# Patient Record
Sex: Female | Born: 1965 | Race: Asian | Hispanic: No | Marital: Married | Smoking: Never smoker
Health system: Southern US, Community
[De-identification: ages and names within clinical notes are randomized; demographics above are authoritative.]

## PROBLEM LIST (undated history)

## (undated) DIAGNOSIS — Z79899 Other long term (current) drug therapy: Secondary | ICD-10-CM

## (undated) DIAGNOSIS — R928 Other abnormal and inconclusive findings on diagnostic imaging of breast: Secondary | ICD-10-CM

## (undated) DIAGNOSIS — G40909 Epilepsy, unspecified, not intractable, without status epilepticus: Secondary | ICD-10-CM

## (undated) DIAGNOSIS — L909 Atrophic disorder of skin, unspecified: Secondary | ICD-10-CM

## (undated) DIAGNOSIS — R002 Palpitations: Secondary | ICD-10-CM

## (undated) DIAGNOSIS — Q838 Other congenital malformations of breast: Secondary | ICD-10-CM

## (undated) DIAGNOSIS — E669 Obesity, unspecified: Secondary | ICD-10-CM

## (undated) DIAGNOSIS — R609 Edema, unspecified: Secondary | ICD-10-CM

## (undated) DIAGNOSIS — L919 Hypertrophic disorder of the skin, unspecified: Secondary | ICD-10-CM

## (undated) DIAGNOSIS — N63 Unspecified lump in unspecified breast: Secondary | ICD-10-CM

## (undated) DIAGNOSIS — Z0000 Encounter for general adult medical examination without abnormal findings: Secondary | ICD-10-CM

## (undated) HISTORY — DX: Unspecified lump in unspecified breast: N63.0

## (undated) HISTORY — DX: Obesity, unspecified: E66.9

## (undated) HISTORY — DX: Atrophic disorder of skin, unspecified: L90.9

## (undated) HISTORY — DX: Palpitations: R00.2

## (undated) HISTORY — DX: Epilepsy, unspecified, not intractable, without status epilepticus: G40.909

## (undated) HISTORY — DX: Other long term (current) drug therapy: Z79.899

## (undated) HISTORY — DX: Edema, unspecified: R60.9

## (undated) HISTORY — PX: HEMORRHOID SURGERY: SHX153

## (undated) HISTORY — DX: Other congenital malformations of breast: Q83.8

## (undated) HISTORY — DX: Hypertrophic disorder of the skin, unspecified: L91.9

## (undated) HISTORY — PX: ELBOW SURGERY: SHX618

## (undated) HISTORY — DX: Encounter for general adult medical examination without abnormal findings: Z00.00

## (undated) HISTORY — DX: Other abnormal and inconclusive findings on diagnostic imaging of breast: R92.8

---

## 2006-07-11 ENCOUNTER — Ambulatory Visit: Payer: Self-pay | Admitting: Family Medicine

## 2007-04-29 DIAGNOSIS — R569 Unspecified convulsions: Secondary | ICD-10-CM

## 2007-10-16 ENCOUNTER — Ambulatory Visit: Payer: Self-pay | Admitting: Family Medicine

## 2007-10-16 ENCOUNTER — Other Ambulatory Visit: Admission: RE | Admit: 2007-10-16 | Discharge: 2007-10-16 | Payer: Self-pay | Admitting: Family Medicine

## 2007-10-16 ENCOUNTER — Encounter (INDEPENDENT_AMBULATORY_CARE_PROVIDER_SITE_OTHER): Payer: Self-pay | Admitting: Internal Medicine

## 2007-10-16 DIAGNOSIS — E669 Obesity, unspecified: Secondary | ICD-10-CM

## 2007-10-16 DIAGNOSIS — N63 Unspecified lump in unspecified breast: Secondary | ICD-10-CM

## 2007-10-23 ENCOUNTER — Encounter (INDEPENDENT_AMBULATORY_CARE_PROVIDER_SITE_OTHER): Payer: Self-pay | Admitting: Internal Medicine

## 2007-10-25 ENCOUNTER — Encounter (INDEPENDENT_AMBULATORY_CARE_PROVIDER_SITE_OTHER): Payer: Self-pay | Admitting: Internal Medicine

## 2007-10-30 ENCOUNTER — Ambulatory Visit: Payer: Self-pay | Admitting: Family Medicine

## 2007-11-01 LAB — CONVERTED CEMR LAB
AST: 18 units/L (ref 0–37)
Basophils Relative: 0.7 % (ref 0.0–1.0)
CO2: 31 meq/L (ref 19–32)
Creatinine, Ser: 0.6 mg/dL (ref 0.4–1.2)
Eosinophils Relative: 1.5 % (ref 0.0–5.0)
HDL: 49.8 mg/dL (ref >39.0)
Hemoglobin: 13 g/dL (ref 12.0–15.0)
MCHC: 34.4 g/dL (ref 30.0–36.0)
Monocytes Absolute: 0.3 10*3/uL (ref 0.2–0.7)
Neutrophils Relative %: 61.9 % (ref 43.0–77.0)
Potassium: 4.5 meq/L (ref 3.5–5.1)
RDW: 11.3 % — ABNORMAL LOW (ref 11.5–14.6)
Sodium: 142 meq/L (ref 135–145)
TSH: 1.15 microintl units/mL (ref 0.35–5.50)
Total Bilirubin: 0.5 mg/dL (ref 0.3–1.2)
Total CHOL/HDL Ratio: 3.6
Total Protein: 6.8 g/dL (ref 6.0–8.3)
Triglycerides: 195 mg/dL — ABNORMAL HIGH (ref 0–149)

## 2008-10-22 ENCOUNTER — Telehealth (INDEPENDENT_AMBULATORY_CARE_PROVIDER_SITE_OTHER): Payer: Self-pay | Admitting: Internal Medicine

## 2008-10-28 ENCOUNTER — Encounter (INDEPENDENT_AMBULATORY_CARE_PROVIDER_SITE_OTHER): Payer: Self-pay | Admitting: Internal Medicine

## 2008-10-28 ENCOUNTER — Other Ambulatory Visit: Admission: RE | Admit: 2008-10-28 | Discharge: 2008-10-28 | Payer: Self-pay | Admitting: Family Medicine

## 2008-10-28 ENCOUNTER — Ambulatory Visit: Payer: Self-pay | Admitting: Family Medicine

## 2008-10-28 LAB — CONVERTED CEMR LAB: Pap Smear: NORMAL

## 2008-11-04 ENCOUNTER — Ambulatory Visit: Payer: Self-pay | Admitting: Family Medicine

## 2008-11-05 ENCOUNTER — Encounter (INDEPENDENT_AMBULATORY_CARE_PROVIDER_SITE_OTHER): Payer: Self-pay | Admitting: *Deleted

## 2008-11-05 LAB — CONVERTED CEMR LAB
ALT: 19 units/L (ref 0–35)
AST: 20 units/L (ref 0–37)
Albumin: 3.7 g/dL (ref 3.5–5.2)
BUN: 8 mg/dL (ref 6–23)
Basophils Absolute: 0 10*3/uL (ref 0.0–0.1)
Basophils Relative: 0.3 % (ref 0.0–3.0)
CO2: 29 meq/L (ref 19–32)
Calcium: 8.8 mg/dL (ref 8.4–10.5)
Chloride: 105 meq/L (ref 96–112)
Cholesterol: 179 mg/dL (ref 0–200)
Creatinine, Ser: 0.6 mg/dL (ref 0.4–1.2)
Eosinophils Relative: 1.5 % (ref 0.0–5.0)
Hemoglobin: 12.8 g/dL (ref 12.0–15.0)
LDL Cholesterol: 112 mg/dL — ABNORMAL HIGH (ref 0–99)
Lymphocytes Relative: 33.9 % (ref 12.0–46.0)
MCHC: 35.6 g/dL (ref 30.0–36.0)
MCV: 87.4 fL (ref 78.0–100.0)
Neutro Abs: 4.3 10*3/uL (ref 1.4–7.7)
RBC: 4.1 M/uL (ref 3.87–5.11)
TSH: 1.32 microintl units/mL (ref 0.35–5.50)
Total Bilirubin: 0.6 mg/dL (ref 0.3–1.2)
Total Protein: 6.4 g/dL (ref 6.0–8.3)
WBC: 7.2 10*3/uL (ref 4.5–10.5)

## 2009-08-04 ENCOUNTER — Ambulatory Visit: Payer: Self-pay | Admitting: Family Medicine

## 2009-08-04 DIAGNOSIS — R609 Edema, unspecified: Secondary | ICD-10-CM | POA: Insufficient documentation

## 2009-08-18 ENCOUNTER — Ambulatory Visit: Payer: Self-pay | Admitting: Family Medicine

## 2009-11-03 ENCOUNTER — Ambulatory Visit: Payer: Self-pay | Admitting: Family Medicine

## 2009-11-03 ENCOUNTER — Encounter (INDEPENDENT_AMBULATORY_CARE_PROVIDER_SITE_OTHER): Payer: Self-pay | Admitting: Internal Medicine

## 2009-11-03 ENCOUNTER — Other Ambulatory Visit: Admission: RE | Admit: 2009-11-03 | Discharge: 2009-11-03 | Payer: Self-pay | Admitting: Family Medicine

## 2009-11-03 DIAGNOSIS — L909 Atrophic disorder of skin, unspecified: Secondary | ICD-10-CM | POA: Insufficient documentation

## 2009-11-03 DIAGNOSIS — L919 Hypertrophic disorder of the skin, unspecified: Secondary | ICD-10-CM

## 2009-11-04 LAB — CONVERTED CEMR LAB
ALT: 19 units/L (ref 0–35)
AST: 17 units/L (ref 0–37)
Basophils Relative: 0.1 % (ref 0.0–3.0)
CO2: 27 meq/L (ref 19–32)
Calcium: 8.7 mg/dL (ref 8.4–10.5)
Cholesterol: 216 mg/dL — ABNORMAL HIGH (ref 0–200)
Creatinine, Ser: 0.6 mg/dL (ref 0.4–1.2)
Eosinophils Absolute: 0.1 10*3/uL (ref 0.0–0.7)
Eosinophils Relative: 1.5 % (ref 0.0–5.0)
GFR calc non Af Amer: 116.84 mL/min (ref >60)
HDL: 54.5 mg/dL (ref >39.00)
Hemoglobin: 13.1 g/dL (ref 12.0–15.0)
Lymphocytes Relative: 33.7 % (ref 12.0–46.0)
Monocytes Relative: 5 % (ref 3.0–12.0)
Neutro Abs: 3.9 10*3/uL (ref 1.4–7.7)
Neutrophils Relative %: 59.7 % (ref 43.0–77.0)
RBC: 4.18 M/uL (ref 3.87–5.11)
Sodium: 137 meq/L (ref 135–145)
VLDL: 38.6 mg/dL (ref 0.0–40.0)
WBC: 6.5 10*3/uL (ref 4.5–10.5)

## 2009-11-09 ENCOUNTER — Encounter (INDEPENDENT_AMBULATORY_CARE_PROVIDER_SITE_OTHER): Payer: Self-pay | Admitting: *Deleted

## 2009-11-24 ENCOUNTER — Encounter (INDEPENDENT_AMBULATORY_CARE_PROVIDER_SITE_OTHER): Payer: Self-pay | Admitting: Internal Medicine

## 2009-12-01 ENCOUNTER — Encounter (INDEPENDENT_AMBULATORY_CARE_PROVIDER_SITE_OTHER): Payer: Self-pay | Admitting: Internal Medicine

## 2009-12-01 ENCOUNTER — Encounter (INDEPENDENT_AMBULATORY_CARE_PROVIDER_SITE_OTHER): Payer: Self-pay | Admitting: *Deleted

## 2010-06-01 ENCOUNTER — Ambulatory Visit: Payer: Self-pay | Admitting: Family Medicine

## 2010-06-01 DIAGNOSIS — R002 Palpitations: Secondary | ICD-10-CM | POA: Insufficient documentation

## 2010-06-02 LAB — CONVERTED CEMR LAB
Albumin: 4.1 g/dL (ref 3.5–5.2)
BUN: 13 mg/dL (ref 6–23)
Basophils Relative: 0.2 % (ref 0.0–3.0)
Calcium: 8.9 mg/dL (ref 8.4–10.5)
Eosinophils Absolute: 0.1 10*3/uL (ref 0.0–0.7)
Eosinophils Relative: 1.2 % (ref 0.0–5.0)
GFR calc non Af Amer: 123.62 mL/min (ref >60)
Glucose, Bld: 83 mg/dL (ref 70–99)
HCT: 34.2 % — ABNORMAL LOW (ref 36.0–46.0)
Lymphs Abs: 2.1 10*3/uL (ref 0.7–4.0)
MCHC: 35.2 g/dL (ref 30.0–36.0)
MCV: 86.4 fL (ref 78.0–100.0)
Monocytes Absolute: 0.4 10*3/uL (ref 0.1–1.0)
Neutrophils Relative %: 60.5 % (ref 43.0–77.0)
Platelets: 293 10*3/uL (ref 150.0–400.0)
RBC: 3.95 M/uL (ref 3.87–5.11)
TSH: 1.04 microintl units/mL (ref 0.35–5.50)
Total Bilirubin: 0.4 mg/dL (ref 0.3–1.2)
WBC: 6.5 10*3/uL (ref 4.5–10.5)

## 2011-01-24 NOTE — Assessment & Plan Note (Signed)
Summary: PALPITATIONS, R/S PER DR Leam Madero/NT   Vital Signs:  Patient profile:   45 year old female Height:      62.25 inches Weight:      171.50 pounds BMI:     31.23 Temp:     98.8 degrees F oral Pulse rate:   68 / minute Pulse rhythm:   regular BP sitting:   100 / 80  (left arm) Cuff size:   regular  Vitals Entered By: Linde Gillis CMA Duncan Dull) (June 01, 2010 12:12 PM) CC: palpitations   History of Present Illness: 45 yo female new to me here for palpitations.  Started two weeks ago.  Mainly occurs at rest.  When first started, had as many as 25 episodes, now is much less frequent.  Started about same time as she changed her diet, cutting out unhealthy foods, eating several small meals per day.  No dietary supplements. Drinks very little caffeine.  Mild "twinge" of chest discomfort once while it was occuring, no other CP. No dizziness, blurred vision, SOB.   No syncope.  Does have a seizure disorder, no recent seizures or changes in dilantin level.  Current Medications (verified): 1)  Dilantin 100 Mg Caps (Phenytoin Sodium Extended) .... Three Capsules At Bedtime.  Allergies (verified): No Known Drug Allergies  Past History:  Past Medical History: Last updated: 04/29/2007 Seizure disorder  Past Surgical History: Last updated: 04/29/2007 R elbow surg as a child Hemorrhoidectomy  Family History: Last updated: 11/03/2009 Father: L&W age 11 Mother: L&W age 72 arthritis knees Siblings: 1 br---1 sis both L&W MGF died age 64 MGM--DM M cousin--colon Ca  Social History: Last updated: 04/29/2007 Marital Status: Married Children: 1 daughter, age 22 ([address] Occupation: Education officer, community in [place] Born in [place]  Review of Systems      See HPI General:  Denies malaise. Eyes:  Denies blurring. ENT:  Denies difficulty swallowing. CV:  Complains of palpitations; denies chest pain or discomfort, difficulty breathing at night, fainting, fatigue, leg cramps with exertion,  lightheadness, near fainting, shortness of breath with exertion, swelling of feet, and swelling of hands. GI:  Denies abdominal pain and change in bowel habits. MS:  Denies joint pain, joint redness, joint swelling, and muscle weakness. Neuro:  Denies falling down, headaches, seizures, visual disturbances, and weakness. Psych:  Denies anxiety and depression. Endo:  Denies cold intolerance, excessive hunger, excessive thirst, excessive urination, heat intolerance, polyuria, and weight change.  Physical Exam  General:  alert, well-developed, well-nourished, and well-hydrated.   Neck:  thyromegaly, nontender Lungs:  normal respiratory effort, no intercostal retractions, no accessory muscle use, and normal breath sounds.   Heart:  normal rate, regular rhythm, and no murmur.   Extremities:  trace non-pitting edema ankles bilat only Neurologic:  alert & oriented X3, sensation intact to light touch, and gait normal.   Psych:  normally interactive and good eye contact.     Impression & Recommendations:  Problem # 1:  PALPITATIONS (ICD-785.1) Assessment New Etiology unknown but appears to be slowly resovling.  EKG and PE exam within normal limits. Will get blood work to rule out reversible causes- TSH, BMET, TSH, CBC. If all blood work within normal limits, will send for holter monitor.  Pt in agreement with plan. Orders: Venipuncture (91478) TLB-CBC Platelet - w/Differential (85025-CBCD) TLB-BMP (Basic Metabolic Panel-BMET) (80048-METABOL) TLB-Hepatic/Liver Function Pnl (80076-HEPATIC) TLB-TSH (Thyroid Stimulating Hormone) (84443-TSH) EKG w/ Interpretation (93000)  Complete Medication List: 1)  Dilantin 100 Mg Caps (Phenytoin sodium extended) .... Three capsules  at bedtime.  Current Allergies (reviewed today): No known allergies

## 2011-01-25 ENCOUNTER — Encounter (INDEPENDENT_AMBULATORY_CARE_PROVIDER_SITE_OTHER): Payer: BC Managed Care – PPO | Admitting: Family Medicine

## 2011-01-25 ENCOUNTER — Encounter: Payer: Self-pay | Admitting: Family Medicine

## 2011-01-25 ENCOUNTER — Other Ambulatory Visit (HOSPITAL_COMMUNITY)
Admission: RE | Admit: 2011-01-25 | Discharge: 2011-01-25 | Disposition: A | Discharge status: Home / Self Care | Payer: BC Managed Care – PPO | Source: Ambulatory Visit | Attending: Family Medicine | Admitting: Family Medicine

## 2011-01-25 ENCOUNTER — Other Ambulatory Visit: Payer: Self-pay | Admitting: Family Medicine

## 2011-01-25 DIAGNOSIS — E785 Hyperlipidemia, unspecified: Secondary | ICD-10-CM

## 2011-01-25 DIAGNOSIS — Z136 Encounter for screening for cardiovascular disorders: Secondary | ICD-10-CM

## 2011-01-25 DIAGNOSIS — Z124 Encounter for screening for malignant neoplasm of cervix: Secondary | ICD-10-CM | POA: Insufficient documentation

## 2011-01-25 DIAGNOSIS — Z0000 Encounter for general adult medical examination without abnormal findings: Secondary | ICD-10-CM

## 2011-01-25 DIAGNOSIS — Q838 Other congenital malformations of breast: Secondary | ICD-10-CM | POA: Insufficient documentation

## 2011-01-25 DIAGNOSIS — Z1159 Encounter for screening for other viral diseases: Secondary | ICD-10-CM | POA: Insufficient documentation

## 2011-01-25 DIAGNOSIS — Z79899 Other long term (current) drug therapy: Secondary | ICD-10-CM

## 2011-01-25 LAB — CBC WITH DIFFERENTIAL/PLATELET
Basophils Absolute: 0 10*3/uL (ref 0.0–0.1)
Basophils Relative: 0.5 % (ref 0.0–3.0)
Hemoglobin: 11.1 g/dL — ABNORMAL LOW (ref 12.0–15.0)
Lymphocytes Relative: 31.2 % (ref 12.0–46.0)
Monocytes Relative: 5.8 % (ref 3.0–12.0)
Neutro Abs: 3.4 10*3/uL (ref 1.4–7.7)
Neutrophils Relative %: 61.8 % (ref 43.0–77.0)
RBC: 3.81 Mil/uL — ABNORMAL LOW (ref 3.87–5.11)

## 2011-01-25 LAB — HEPATIC FUNCTION PANEL
AST: 17 U/L (ref 0–37)
Albumin: 4 g/dL (ref 3.5–5.2)
Total Bilirubin: 0.2 mg/dL — ABNORMAL LOW (ref 0.3–1.2)

## 2011-01-25 LAB — BASIC METABOLIC PANEL
BUN: 7 mg/dL (ref 6–23)
Calcium: 9.2 mg/dL (ref 8.4–10.5)
GFR: 102.26 mL/min (ref >60.00)
Glucose, Bld: 86 mg/dL (ref 70–99)
Potassium: 4.3 mEq/L (ref 3.5–5.1)
Sodium: 139 mEq/L (ref 135–145)

## 2011-01-25 LAB — LIPID PANEL
HDL: 76.5 mg/dL (ref >39.00)
Triglycerides: 75 mg/dL (ref 0.0–149.0)
VLDL: 15 mg/dL (ref 0.0–40.0)

## 2011-01-25 LAB — HM PAP SMEAR

## 2011-01-25 LAB — LDL CHOLESTEROL, DIRECT: Direct LDL: 122.7 mg/dL

## 2011-01-27 ENCOUNTER — Encounter: Payer: Self-pay | Admitting: Family Medicine

## 2011-02-01 ENCOUNTER — Ambulatory Visit: Payer: Self-pay | Admitting: Family Medicine

## 2011-02-01 ENCOUNTER — Encounter: Payer: Self-pay | Admitting: Family Medicine

## 2011-02-01 NOTE — Letter (Signed)
Summary: Results Follow up Letter  [place] at [facility]  [address] [place], [place] [address]   Phone: [phone]  Fax: [phone]    01/27/2011 MRN: [id number]  [facility] [address] [place], [place]  [address]  Dear Ms. Bally,  The following are the results of your recent test(s):  Test         Result    Pap Smear:        Normal __X___  Not Normal _____ Comments: Repeat in 2-3 years. ______________________________________________________ Cholesterol: LDL(Bad cholesterol):         Your goal is less than:         HDL (Good cholesterol):       Your goal is more than: Comments:  ______________________________________________________ Mammogram:        Normal _____  Not Normal _____ Comments:  ___________________________________________________________________ Hemoccult:        Normal _____  Not normal _______ Comments:    _____________________________________________________________________ Other Tests:    We routinely do not discuss normal results over the telephone.  If you desire a copy of the results, or you have any questions about this information we can discuss them at your next office visit.   Sincerely,     Dr. Ruthe Mannan

## 2011-02-01 NOTE — Assessment & Plan Note (Signed)
Summary: CPX/TRANSFER FROM [facility]IE/CLE   Vital Signs:  Patient profile:   45 year old female Height:      62.25 inches Weight:      148.50 pounds BMI:     27.04 Temp:     98.3 degrees F oral Pulse rate:   72 / minute Pulse rhythm:   regular BP sitting:   100 / 70  (right arm) Cuff size:   regular  Vitals Entered By: Linde Gillis CMA Duncan Dull) (January 25, 2011 9:15 AM) CC: complete physicial with pap   History of Present Illness: 45 yo here for CPX.  Seizure disorder- has not had a seizure in years, followed by neurology. Pt requests labs drawn for neurology to be added to her labs today.  Heart palpitations- started taking Zumba classes, has lost over 20 pounds since last office visit!!  Palpitations are very infrequent.  No CP, SOB or dizziness.  Well woman- no recent h/o abnormal pap smears. Denies any dysuria or vaginal discharge.  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  Exercising a lot now and feels it rubbing and pressure.  Current Medications (verified): 1)  Dilantin 100 Mg Caps (Phenytoin Sodium Extended) .... Three Capsules At Bedtime.  Allergies (verified): No Known Drug Allergies  Past History:  Past Medical History: Last updated: 04/29/2007 Seizure disorder  Past Surgical History: Last updated: 04/29/2007 R elbow surg as a child Hemorrhoidectomy  Family History: Last updated: 11/03/2009 Father: L&W age 26 Mother: L&W age 80 arthritis knees Siblings: 1 br---1 sis both L&W MGF died age 59 MGM--DM M cousin--colon Ca  Social History: Last updated: 04/29/2007 Marital Status: Married Children: 1 daughter, age 68 ([address] Occupation: Education officer, community in [place] Born in [place]  Risk Factors: Alcohol Use: 0 (11/03/2009) Caffeine Use: 0 (10/28/2008) Exercise: yes (11/03/2009)  Risk Factors: Smoking Status: never (11/03/2009) Passive Smoke Exposure: no  (10/28/2008)  Review of Systems      See HPI General:  Denies malaise. Eyes:  Denies blurring. ENT:  Denies difficulty swallowing. CV:  Denies chest pain or discomfort. Resp:  Denies shortness of breath. GI:  Denies abdominal pain and change in bowel habits. GU:  Denies abnormal vaginal bleeding and dysuria. MS:  Denies joint pain, joint redness, and joint swelling. Derm:  Denies rash. Neuro:  Denies headaches. Psych:  Denies anxiety and depression. Endo:  Denies cold intolerance and heat intolerance. Heme:  Denies abnormal bruising and bleeding.  Physical Exam  General:  alert, well-developed, well-nourished, and well-hydrated.   Head:  normocephalic, atraumatic, and no abnormalities observed.   Eyes:  vision grossly intact, pupils equal, pupils round, and pupils reactive to light.   Ears:  R ear normal and L ear normal.   Nose:  no external deformity.   Mouth:  good dentition.   Neck:  No deformities, masses, or tenderness noted. Breasts:  No mass, nodules, thickening, tenderness, bulging, retraction, inflamation, nipple discharge or skin changes noted.  Does have a large palpable, non tender and soft mass under right axilla. Lungs:  normal respiratory effort, no intercostal retractions, no accessory muscle use, and normal breath sounds.   Heart:  normal rate, regular rhythm, and no murmur.   Abdomen:  Bowel sounds positive,abdomen soft and non-tender without masses, organomegaly or hernias noted. Rectal:  no external abnormalities and no hemorrhoids.   Genitalia:  Pelvic Exam:        External: normal female genitalia without  lesions or masses        Vagina: normal without lesions or masses        Cervix: normal without lesions or masses        Adnexa: normal bimanual exam without masses or fullness        Uterus: normal by palpation        Pap smear: performed Msk:  No deformity or scoliosis noted of thoracic or lumbar spine.   Extremities:  no edema Neurologic:  alert &  oriented X3, sensation intact to light touch, and gait normal.   Skin:  Intact without suspicious lesions or rashes Psych:  normally interactive and good eye contact.     Impression & Recommendations:  Problem # 1:  Preventive Health Care (ICD-V70.0) Reviewed preventive care protocols, scheduled due services, and updated immunizations Discussed nutrition, exercise, diet, and healthy lifestyle.  Pap smear today. FLP, BMET today. Mammogram ordered today. Orders: Venipuncture (62130) TLB-BMP (Basic Metabolic Panel-BMET) (80048-METABOL)  Problem # 2:  LONG-TERM (CURRENT) USE OF OTHER MEDICATIONS (ICD-V58.69) Assessment: Unchanged Liver function, CBC, dilantin level. Will also order DEXA as she has been on dilantin for >10 years. Orders: Radiology Referral (Radiology) TLB-Hepatic/Liver Function Pnl (80076-HEPATIC) TLB-CBC Platelet - w/Differential (85025-CBCD)  Problem # 3:  PALPITATIONS (ICD-785.1) Assessment: Improved Congratulated her on her weight loss!  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 Orders: Surgical Referral (Surgery)  Complete Medication List: 1)  Dilantin 100 Mg Caps (Phenytoin sodium extended) .... Three capsules at bedtime.  Other Orders: TLB-Lipid Panel (80061-LIPID) T-Dilantin (Phenytoin) (86578-46962)  Patient Instructions: 1)  Please stop by to see Shirlee Limerick on your way out to set up your surgery appointment, your bone density test and your mammogram.   Orders Added: 1)  Radiology Referral [Radiology] 2)  TLB-Lipid Panel [80061-LIPID] 3)  Venipuncture [36415] 4)  TLB-BMP (Basic Metabolic Panel-BMET) [80048-METABOL] 5)  T-Dilantin (Phenytoin) [95284-13244] 6)  Radiology Referral [Radiology] 7)  TLB-Hepatic/Liver Function Pnl [80076-HEPATIC] 8)  Surgical Referral [Surgery] 9)  TLB-CBC Platelet - w/Differential [85025-CBCD] 10)  Est. Patient 40-64  years [01027]    Current Allergies (reviewed today): No known allergies      Prevention & Chronic Care Immunizations   Influenza vaccine: Not documented    Tetanus booster: 11/03/2009: Tdap   Tetanus booster due: 11/04/2019    Pneumococcal vaccine: Not documented  Other Screening   Pap smear: NEGATIVE FOR INTRAEPITHELIAL LESIONS OR MALIGNANCY.  (11/03/2009)   Pap smear action/deferral: Ordered  (01/25/2011)   Pap smear due: 10/2009    Mammogram: Normal  (11/24/2009)   Mammogram action/deferral: Ordered  (01/25/2011)   Mammogram due: 11/24/2010 or 11/25/2011   Smoking status: never  (11/03/2009)  Lipids   Total Cholesterol: 216  (11/03/2009)   Lipid panel action/deferral: Lipid Panel ordered   LDL: 112  (11/04/2008)   LDL Direct: 138.7  (11/03/2009)   HDL: 54.50  (11/03/2009)   Triglycerides: 193.0  (11/03/2009)   Nursing Instructions: Pap smear today Schedule screening mammogram (see order)

## 2011-02-06 ENCOUNTER — Encounter: Payer: Self-pay | Admitting: Family Medicine

## 2011-02-08 ENCOUNTER — Encounter: Payer: Self-pay | Admitting: Family Medicine

## 2011-02-15 ENCOUNTER — Ambulatory Visit: Payer: Self-pay | Admitting: Family Medicine

## 2011-02-15 ENCOUNTER — Encounter: Payer: Self-pay | Admitting: Family Medicine

## 2011-02-15 NOTE — Letter (Signed)
Summary: Request for Diagnosis [facility]  Request for Diagnosis Code/[facility]   Imported By: Maryln Gottron 02/10/2011 13:30:29  _____________________________________________________________________  External Attachment:    Type:   Image     Comment:   External Document

## 2011-02-15 NOTE — Letter (Signed)
Summary: Results Follow up Letter  [place] at [facility]  [address] [place], [place] [address]   Phone: [phone]  Fax: [phone]    02/06/2011 MRN: [id number]  [facility] [address] [place], [place]  [address]  Dear Ms. Noyola,  The following are the results of your recent test(s):  Test         Result    Pap Smear:        Normal _____  Not Normal _____ Comments: ______________________________________________________ Cholesterol: LDL(Bad cholesterol):         Your goal is less than:         HDL (Good cholesterol):       Your goal is more than: Comments:  ______________________________________________________ Mammogram:        Normal _____  Not Normal _____ Comments:  ___________________________________________________________________ Hemoccult:        Normal _____  Not normal _______ Comments:    _____________________________________________________________________ Other Tests: Bone Density test was normal.    We routinely do not discuss normal results over the telephone.  If you desire a copy of the results, or you have any questions about this information we can discuss them at your next office visit.   Sincerely,      Dr. Ruthe Mannan

## 2011-02-20 ENCOUNTER — Encounter: Payer: Self-pay | Admitting: Family Medicine

## 2011-02-20 DIAGNOSIS — R928 Other abnormal and inconclusive findings on diagnostic imaging of breast: Secondary | ICD-10-CM | POA: Insufficient documentation

## 2011-02-21 ENCOUNTER — Encounter: Payer: Self-pay | Admitting: Family Medicine

## 2011-03-01 ENCOUNTER — Encounter: Payer: Self-pay | Admitting: Family Medicine

## 2011-03-01 ENCOUNTER — Ambulatory Visit: Payer: Self-pay | Admitting: Family Medicine

## 2011-03-01 ENCOUNTER — Telehealth (INDEPENDENT_AMBULATORY_CARE_PROVIDER_SITE_OTHER): Payer: Self-pay | Admitting: *Deleted

## 2011-03-02 ENCOUNTER — Encounter: Payer: Self-pay | Admitting: Family Medicine

## 2011-03-02 NOTE — Miscellaneous (Signed)
Summary: Orders Update  Clinical Lists Changes  Problems: Added new problem of MAMMOGRAM, ABNORMAL, RIGHT (ICD-793.80) Orders: Added new Referral order of Radiology Referral (Radiology) - Signed 

## 2011-03-07 NOTE — Letter (Signed)
Summary: Results Follow up Letter  [place] at [facility]  [address] [place], [place] [address]   Phone: [phone]  Fax: [phone]    03/02/2011 MRN: [id number]  [facility] [address] [place], [place]  [address]  Dear Christine Saunders,  The following are the results of your recent test(s):  Test         Result    Pap Smear:        Normal _____  Not Normal _____ Comments: ______________________________________________________ Cholesterol: LDL(Bad cholesterol):         Your goal is less than:         HDL (Good cholesterol):       Your goal is more than: Comments:  ______________________________________________________ Mammogram:        Normal __X___  Not Normal _____ Comments: Additional views of your mammogram were benign.  Repeat in one year.  ___________________________________________________________________ Hemoccult:        Normal _____  Not normal _______ Comments:    _____________________________________________________________________ Other Tests:    We routinely do not discuss normal results over the telephone.  If you desire a copy of the results, or you have any questions about this information we can discuss them at your next office visit.   Sincerely,     Dr. Ruthe Mannan

## 2011-03-23 NOTE — Progress Notes (Signed)
Summary: code needs to be changed for dexa  Phone Note Call from Patient Call back at [phone], cell, leave message   Caller: Patient Summary of Call: Pt states her neurologist had wanted pt to have a bone density test, which you ordered, but pt states it was ordered as a diagnostic, should have been ordered as screening, and now pt's insurance wont cover the cost.  Pt is asking that we change the code to a screening test.  If you have any questions please call Darl Pikes at Dr Balinda Quails office- [phone]. Initial call taken by: Lowella Petties CMA, AAMA,  March 01, 2011 10:26 AM  Follow-up for Phone Call        Aram Beecham or Jamesetta So, please look into this for me. Ruthe Mannan MD  March 01, 2011 10:33 AM   Additional Follow-up for Phone Call Additional follow up Details #1::        Sent request to [facility] for review, asking if he can review charged. If we can refile as a screeming code.Daine Gip  March 01, 2011 3:37 PM

## 2012-03-27 ENCOUNTER — Encounter: Payer: Self-pay | Admitting: Family Medicine

## 2012-03-27 ENCOUNTER — Other Ambulatory Visit: Payer: Self-pay | Admitting: Family Medicine

## 2012-03-27 ENCOUNTER — Other Ambulatory Visit (INDEPENDENT_AMBULATORY_CARE_PROVIDER_SITE_OTHER): Payer: BC Managed Care – PPO

## 2012-03-27 ENCOUNTER — Ambulatory Visit (INDEPENDENT_AMBULATORY_CARE_PROVIDER_SITE_OTHER): Payer: BC Managed Care – PPO | Admitting: Family Medicine

## 2012-03-27 DIAGNOSIS — Z136 Encounter for screening for cardiovascular disorders: Secondary | ICD-10-CM

## 2012-03-27 DIAGNOSIS — Z0000 Encounter for general adult medical examination without abnormal findings: Secondary | ICD-10-CM

## 2012-03-27 DIAGNOSIS — Z79899 Other long term (current) drug therapy: Secondary | ICD-10-CM

## 2012-03-27 LAB — LIPID PANEL
HDL: 79 mg/dL (ref >39.00)
Total CHOL/HDL Ratio: 3
Triglycerides: 63 mg/dL (ref 0.0–149.0)

## 2012-03-27 LAB — COMPREHENSIVE METABOLIC PANEL
AST: 18 U/L (ref 0–37)
Albumin: 4 g/dL (ref 3.5–5.2)
Alkaline Phosphatase: 66 U/L (ref 39–117)
CO2: 27 mEq/L (ref 19–32)
Calcium: 9.3 mg/dL (ref 8.4–10.5)
Creatinine, Ser: 0.6 mg/dL (ref 0.4–1.2)
Sodium: 139 mEq/L (ref 135–145)

## 2012-03-27 LAB — LDL CHOLESTEROL, DIRECT: Direct LDL: 107.7 mg/dL

## 2012-03-27 NOTE — Progress Notes (Signed)
No show

## 2012-03-28 LAB — PHENYTOIN LEVEL, TOTAL: Phenytoin Lvl: 17.9 ug/mL (ref 10.0–20.0)

## 2012-04-03 ENCOUNTER — Other Ambulatory Visit: Payer: BC Managed Care – PPO

## 2012-04-10 ENCOUNTER — Encounter: Payer: BC Managed Care – PPO | Admitting: Family Medicine

## 2012-05-08 ENCOUNTER — Encounter: Payer: BC Managed Care – PPO | Admitting: Family Medicine

## 2012-06-12 ENCOUNTER — Ambulatory Visit (INDEPENDENT_AMBULATORY_CARE_PROVIDER_SITE_OTHER): Payer: BC Managed Care – PPO | Admitting: Family Medicine

## 2012-06-12 ENCOUNTER — Encounter: Payer: Self-pay | Admitting: Family Medicine

## 2012-06-12 ENCOUNTER — Other Ambulatory Visit (HOSPITAL_COMMUNITY)
Admission: RE | Admit: 2012-06-12 | Discharge: 2012-06-12 | Disposition: A | Discharge status: Home / Self Care | Payer: BC Managed Care – PPO | Source: Ambulatory Visit | Attending: Family Medicine | Admitting: Family Medicine

## 2012-06-12 VITALS — BP 100/60 | HR 64 | Temp 98.3°F | Ht 63.25 in | Wt 145.0 lb

## 2012-06-12 DIAGNOSIS — Z1231 Encounter for screening mammogram for malignant neoplasm of breast: Secondary | ICD-10-CM

## 2012-06-12 DIAGNOSIS — Z0000 Encounter for general adult medical examination without abnormal findings: Secondary | ICD-10-CM

## 2012-06-12 DIAGNOSIS — Z01419 Encounter for gynecological examination (general) (routine) without abnormal findings: Secondary | ICD-10-CM | POA: Insufficient documentation

## 2012-06-12 DIAGNOSIS — R569 Unspecified convulsions: Secondary | ICD-10-CM

## 2012-06-12 MED ORDER — PIMECROLIMUS 1 % EX CREA: TOPICAL_CREAM | Freq: Two times a day (BID) | CUTANEOUS | Status: AC

## 2012-06-12 NOTE — Addendum Note (Signed)
Addended by: Eliezer Bottom on: 06/12/2012 09:56 AM   Modules accepted: Orders

## 2012-06-12 NOTE — Patient Instructions (Addendum)
Great to see you. Please stop by to see Christine Saunders on your way out to set up your mammogram.    

## 2012-06-12 NOTE — Progress Notes (Signed)
46yo here for CPX.  Seizure disorder- has not had a seizure in years, followed by neurology.  Lab Results  Component Value Date   PHENYTOIN 17.9 03/27/2012   Well woman- due for mammogram She would like a pap smear today.  Working on diet, continuing to do [employer]. Lipid panel much improved!  Feels great. Lab Results  Component Value Date   CHOL 201* 03/27/2012   HDL 79.00 03/27/2012   LDLCALC 112* 11/04/2008   LDLDIRECT 107.7 03/27/2012   TRIG 63.0 03/27/2012   CHOLHDL 3 03/27/2012    Patient Active Problem List  Diagnosis  . OBESITY  . BREAST MASSES, BILATERAL  . UNSPECIFIED HYPERTROPHIC&ATROPHIC CONDITION SKIN  . SEIZURE DISORDER  . DEPENDENT EDEMA, LEGS, BILATERAL  . PALPITATIONS  . SPECIFIED CONGENITAL ANOMALIES OF BREAST  . MAMMOGRAM, ABNORMAL, RIGHT  . Routine general medical examination at a health care facility   Past Medical History  Diagnosis Date  . Mass of breast     bilateral  . Edema     dependent edema of legs, bilateral  . Encounter for long-term (current) use of other medications   . Abnormal mammogram, unspecified   . Obesity, unspecified   . Palpitations   . Routine general medical examination at a health care facility   . Seizure disorder   . Specified congenital anomalies of breast   . Unspecified hypertrophic and atrophic condition of skin    Past Surgical History  Procedure Date  . Elbow surgery     right, as a child  . Hemorrhoid surgery    History  Substance Use Topics  . Smoking status: Not on file  . Smokeless tobacco: Not on file  . Alcohol Use:    Family History  Problem Relation Age of Onset  . Arthritis Mother     knees  . Diabetes Maternal Grandmother   . Cancer Cousin     maternal cousin, colon cancer   Allergies not on file Current Outpatient Prescriptions on File Prior to Visit  Medication Sig Dispense Refill  . phenytoin (DILANTIN) 100 MG ER capsule Take by mouth 3 (three) times daily.       The PMH, PSH, Social History,  Family History, Medications, and allergies have been reviewed in [facility], and have been updated if relevant.   Review of Systems  See HPI  Patient reports no  vision/ hearing changes,anorexia, weight change, fever ,adenopathy, persistant / recurrent hoarseness, swallowing issues, chest pain, edema,persistant / recurrent cough, hemoptysis, dyspnea(rest, exertional, paroxysmal nocturnal), gastrointestinal  bleeding (melena, rectal bleeding), abdominal pain, excessive heart burn, GU symptoms(dysuria, hematuria, pyuria, voiding/incontinence  Issues) syncope, focal weakness, severe memory loss, concerning skin lesions, depression, anxiety, abnormal bruising/bleeding, major joint swelling, breast masses or abnormal vaginal bleeding.    Physical Exam  BP 100/60  Pulse 64  Temp 98.3 F (36.8 C)  Ht 5' 3.25" (1.607 m)  Wt 145 lb (65.772 kg)  BMI 25.48 kg/m2 Wt Readings from Last 3 Encounters:  06/12/12 145 lb (65.772 kg)  01/25/11 148 lb 8 oz (67.359 kg)  06/01/10 171 lb 8 oz (77.792 kg)    General:  Well-developed,well-nourished,in no acute distress; alert,appropriate and cooperative throughout examination Head:  normocephalic and atraumatic.   Eyes:  vision grossly intact, pupils equal, pupils round, and pupils reactive to light.   Ears:  R ear normal and L ear normal.   Nose:  no external deformity.   Mouth:  good dentition.   Neck:  No deformities, masses, or tenderness  noted. Breasts:  No mass, nodules, thickening, tenderness, bulging, retraction, inflamation, nipple discharge or skin changes noted.   Lungs:  Normal respiratory effort, chest expands symmetrically. Lungs are clear to auscultation, no crackles or wheezes. Heart:  Normal rate and regular rhythm. S1 and S2 normal without gallop, murmur, click, rub or other extra sounds. Abdomen:  Bowel sounds positive,abdomen soft and non-tender without masses, organomegaly or hernias noted. Rectal:  no external abnormalities.   Genitalia:   Pelvic Exam:        External: normal female genitalia without lesions or masses        Vagina: normal without lesions or masses        Cervix: normal without lesions or masses        Adnexa: normal bimanual exam without masses or fullness        Uterus: normal by palpation        Pap smear: performed Msk:  No deformity or scoliosis noted of thoracic or lumbar spine.   Extremities:  No clubbing, cyanosis, edema, or deformity noted with normal full range of motion of all joints.   Neurologic:  alert & oriented X3 and gait normal.   Skin:  Intact without suspicious lesions or rashes, multiple nevi, non appear dysplastic. Cervical Nodes:  No lymphadenopathy noted Axillary Nodes:  No palpable lymphadenopathy Psych:  Cognition and judgment appear intact. Alert and cooperative with normal attention span and concentration. No apparent delusions, illusions, hallucinations  Assessment and Plan:  1. Routine general medical examination at a health care facility  Reviewed preventive care protocols, scheduled due services, and updated immunizations Discussed nutrition, exercise, diet, and healthy lifestyle.    2. SEIZURE DISORDER  Stable, no recurrent seizures.   3. Other screening mammogram  MM Digital Screening

## 2012-06-13 ENCOUNTER — Encounter: Payer: Self-pay | Admitting: *Deleted

## 2012-07-03 ENCOUNTER — Ambulatory Visit: Payer: Self-pay | Admitting: Family Medicine

## 2012-07-03 ENCOUNTER — Encounter: Payer: Self-pay | Admitting: Family Medicine

## 2012-07-05 ENCOUNTER — Encounter: Payer: Self-pay | Admitting: *Deleted

## 2012-07-05 ENCOUNTER — Encounter: Payer: Self-pay | Admitting: Family Medicine

## 2013-07-03 IMAGING — MG MM CAD SCREENING MAMMO
1 series · 4 of 4 positions shown · non-contrast
Comparison: none

REASON FOR EXAM: SCR MAMMO NO ORDER
COMMENTS:

PROCEDURE:     MAM - MAM DGTL SCRN MAM NO ORDER W/CAD  - July 03, 2012  [DATE]
RESULT:     No dominant masses or pathologic clustered calcifications
demonstrated.  Moderately dense, nodular parenchymal pattern is present. The
exam is stable from multiple prior exams. CAD evaluation is nonfocal.

[R CC · right · 4 of 4 slices shown]
[im 1/4]
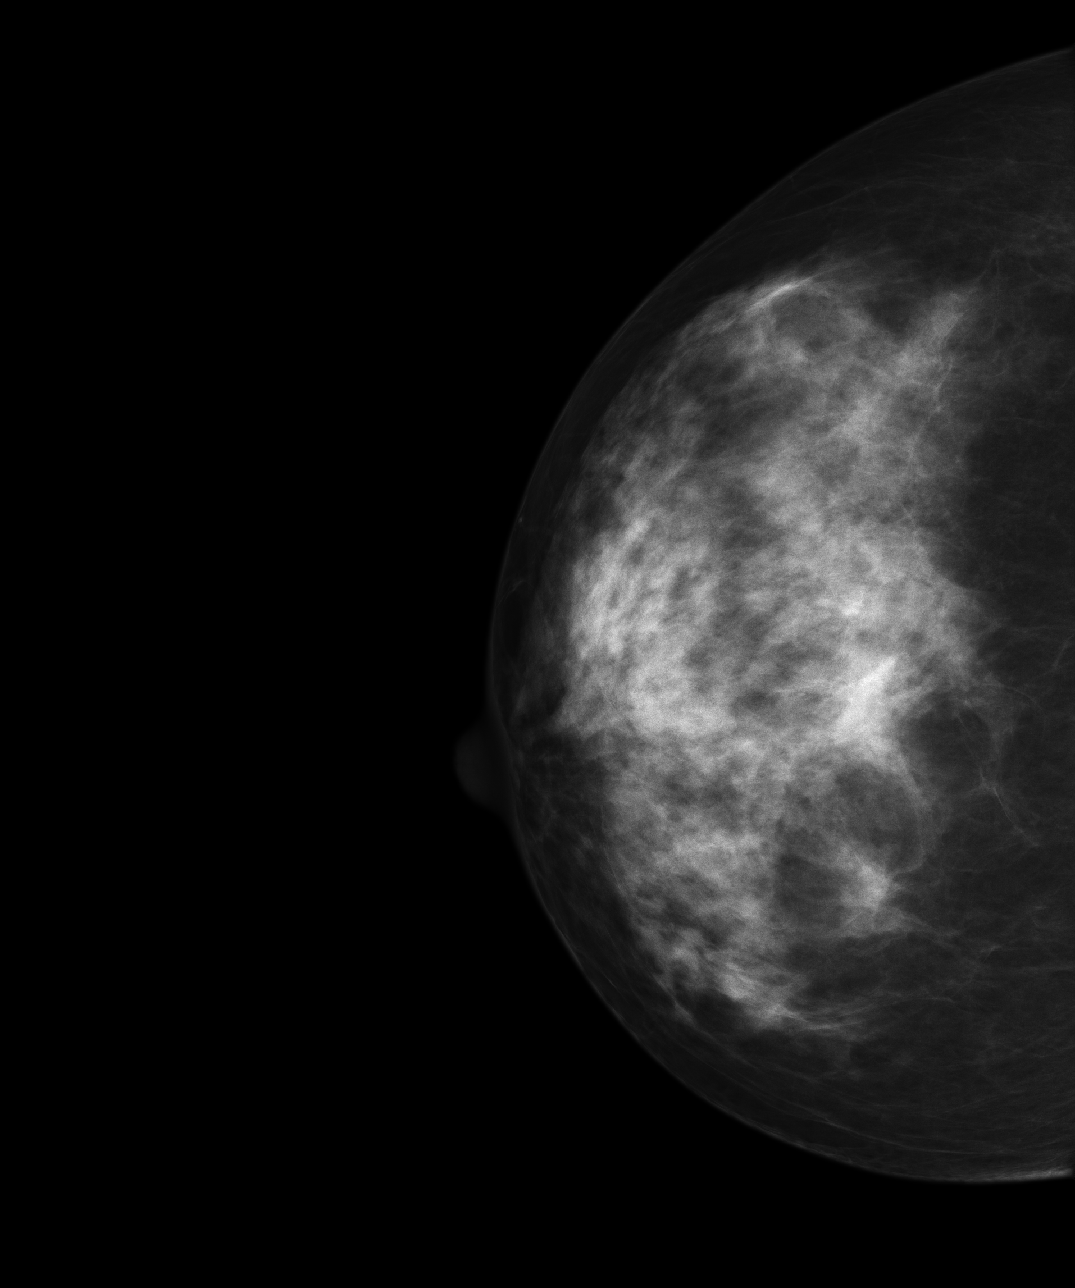
[im 2/4]
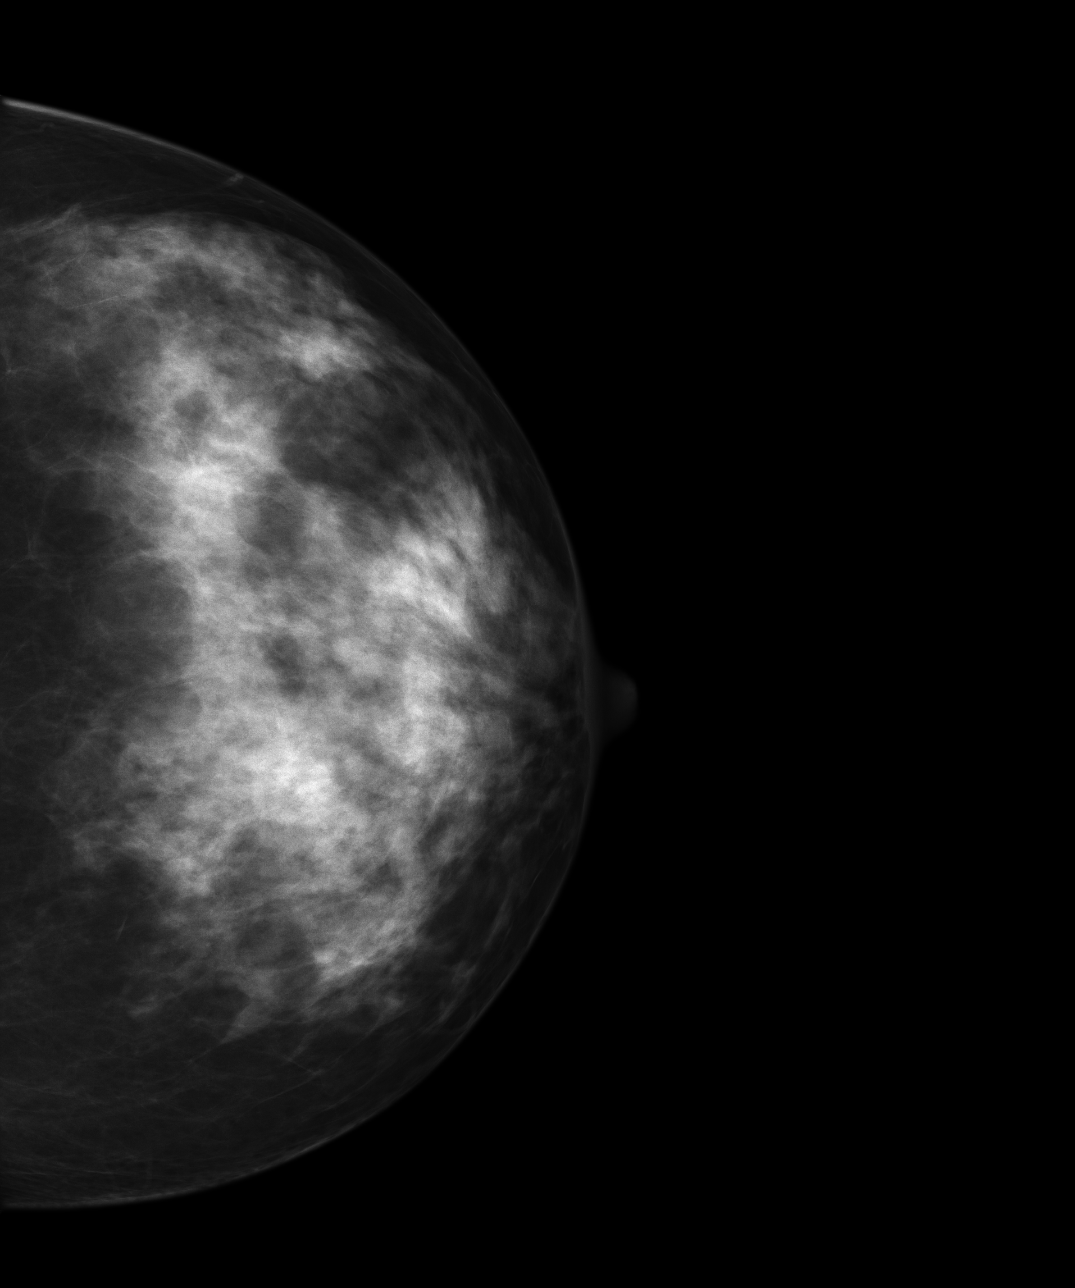
[im 3/4]
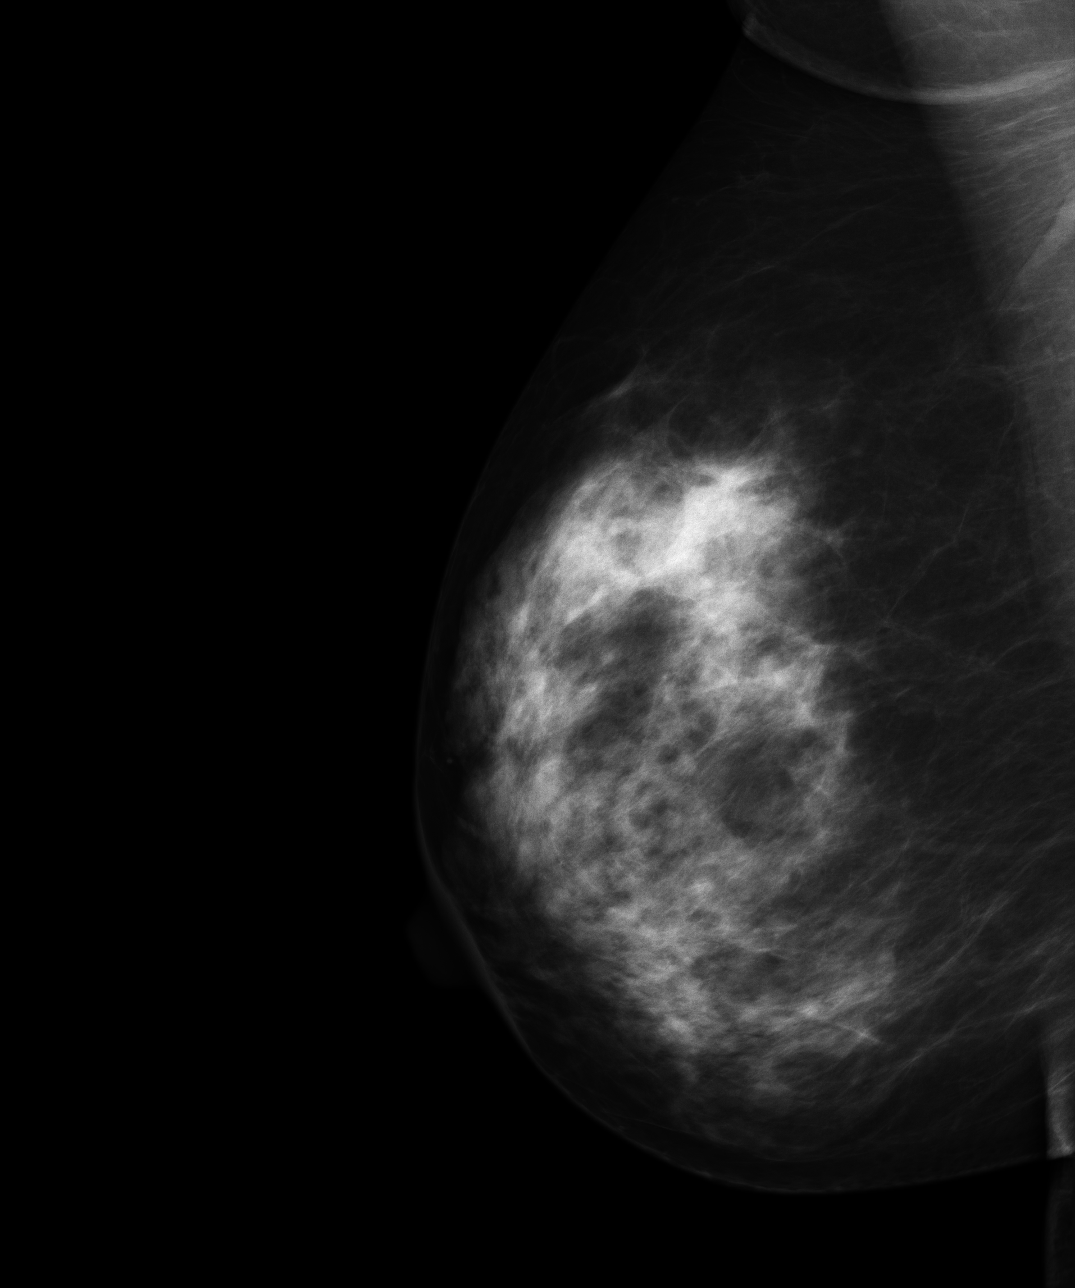
[im 4/4]
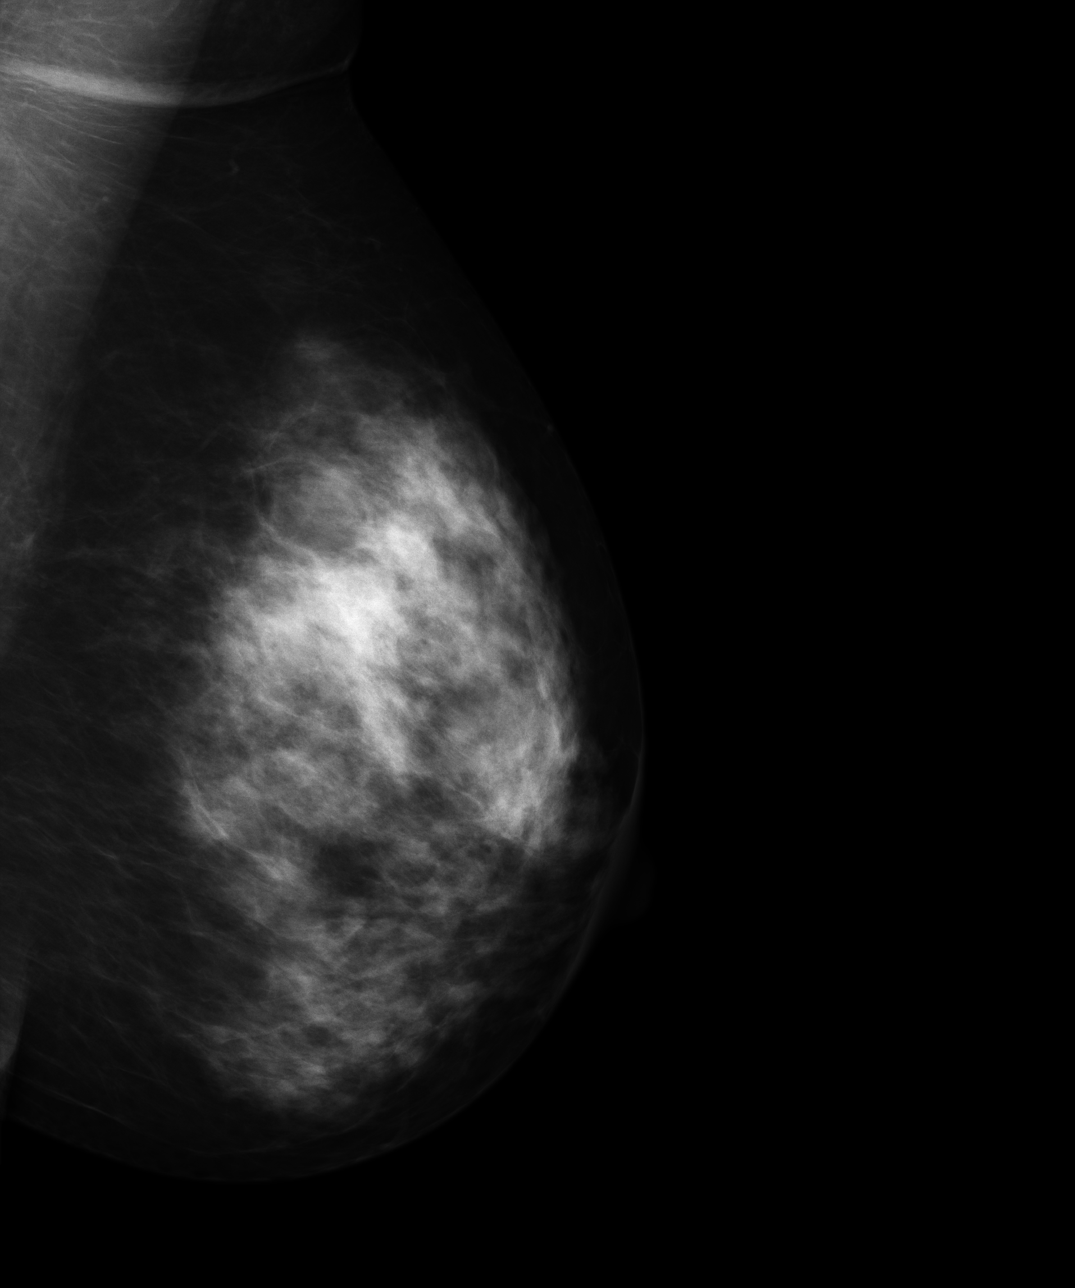

[4 of 4 positions shown; findings below may reference images not displayed]

IMPRESSION: 1. Stable, benign exam. Yearly follow-up mammogram suggested.

BI-RADS: Category 2 - Benign Finding.

A NEGATIVE MAMMOGRAM REPORT DOES NOT PRECLUDE BIOPSY OR OTHER EVALUATION OF
A CLINICALLY PALPABLE OR OTHERWISE SUSPICIOUS MASS OR LESION. BREAST CANCER
MAY NOT BE DETECTED BY MAMMOGRAPHY IN UP TO 10% OF CASES.

## 2013-11-12 ENCOUNTER — Encounter: Payer: Self-pay | Admitting: Internal Medicine

## 2013-11-12 ENCOUNTER — Ambulatory Visit (INDEPENDENT_AMBULATORY_CARE_PROVIDER_SITE_OTHER): Payer: BC Managed Care – PPO | Admitting: Internal Medicine

## 2013-11-12 VITALS — BP 110/68 | HR 76 | Temp 98.4°F | Wt 149.0 lb

## 2013-11-12 DIAGNOSIS — Z0000 Encounter for general adult medical examination without abnormal findings: Secondary | ICD-10-CM

## 2013-11-12 DIAGNOSIS — R5381 Other malaise: Secondary | ICD-10-CM

## 2013-11-12 NOTE — Progress Notes (Signed)
Pre-visit discussion using our clinic review tool. No additional management support is needed unless otherwise documented below in the visit note.  

## 2013-11-12 NOTE — Patient Instructions (Signed)

## 2013-11-12 NOTE — Progress Notes (Signed)
Subjective:    Patient ID: Christine Saunders, female    DOB: [birth year], 47 y.o.   MRN: [id number]  HPI  Pt presents to the clinic today for her annual exam: She has no concerns today.  Flu: 09/2013 Tetanus: 2010 LMP: 11/06/13 Pap Smear: 2013 Mammogram: 2013 Eye doctor: yearly  Dentist: yearly  Review of Systems      Past Medical History  Diagnosis Date  . Mass of breast     bilateral  . Edema     dependent edema of legs, bilateral  . Encounter for long-term (current) use of other medications   . Abnormal mammogram, unspecified   . Obesity, unspecified   . Palpitations   . Routine general medical examination at a health care facility   . Seizure disorder   . Specified congenital anomalies of breast   . Unspecified hypertrophic and atrophic condition of skin     Current Outpatient Prescriptions  Medication Sig Dispense Refill  . primidone (MYSOLINE) 50 MG tablet Take 25 mg by mouth 2 (two) times daily.      . vitamin B-12 (CYANOCOBALAMIN) 1000 MCG tablet Take 1,000 mcg by mouth daily.       No current facility-administered medications for this visit.    No Known Allergies  Family History  Problem Relation Age of Onset  . Arthritis Mother     knees  . Diabetes Maternal Grandmother   . Cancer Cousin     maternal cousin, colon cancer    History   Social History  . Marital Status: Married    Spouse Name: N/A    Number of Children: 1  . Years of Education: N/A   Occupational History  . dentist     in [place]   Social History Main Topics  . Smoking status: Never Smoker   . Smokeless tobacco: Not on file  . Alcohol Use: Not on file  . Drug Use: Not on file  . Sexual Activity: Not on file   Other Topics Concern  . Not on file   Social History Narrative   Born in [place].     Constitutional: Denies fever, malaise, fatigue, headache or abrupt weight changes.  HEENT: Denies eye pain, eye redness, ear pain, ringing in the ears, wax buildup, runny nose,  nasal congestion, bloody nose, or sore throat. Respiratory: Denies difficulty breathing, shortness of breath, cough or sputum production.   Cardiovascular: Denies chest pain, chest tightness, palpitations or swelling in the hands or feet.  Gastrointestinal: Denies abdominal pain, bloating, constipation, diarrhea or blood in the stool.  GU: Denies urgency, frequency, pain with urination, burning sensation, blood in urine, odor or discharge. Musculoskeletal: Denies decrease in range of motion, difficulty with gait, muscle pain or joint pain and swelling.  Skin: Denies redness, rashes, lesions or ulcercations.  Neurological: Denies dizziness, difficulty with memory, difficulty with speech or problems with balance and coordination.   No other specific complaints in a complete review of systems (except as listed in HPI above).  Objective:   Physical Exam   BP 110/68  Pulse 76  Temp(Src) 98.4 F (36.9 C) (Oral)  Wt 149 lb (67.586 kg)  SpO2 98%  LMP 11/06/2013 Wt Readings from Last 3 Encounters:  11/12/13 149 lb (67.586 kg)  06/12/12 145 lb (65.772 kg)  01/25/11 148 lb 8 oz (67.359 kg)    General: Appears her stated age, well developed, well nourished in NAD. Skin: Warm, dry and intact. No rashes, lesions or ulcerations noted. HEENT: Head: normal  shape and size; Eyes: sclera white, no icterus, conjunctiva pink, PERRLA and EOMs intact; Ears: Tm's gray and intact, normal light reflex; Nose: mucosa pink and moist, septum midline; Throat/Mouth: Teeth present, mucosa pink and moist, no exudate, lesions or ulcerations noted.  Neck: Normal range of motion. Neck supple, trachea midline. No massses, lumps or thyromegaly present.  Cardiovascular: Normal rate and rhythm. S1,S2 noted.  No murmur, rubs or gallops noted. No JVD or BLE edema. No carotid bruits noted. Pulmonary/Chest: Normal effort and positive vesicular breath sounds. No respiratory distress. No wheezes, rales or ronchi noted.  Abdomen:  Soft and nontender. Normal bowel sounds, no bruits noted. No distention or masses noted. Liver, spleen and kidneys non palpable. Musculoskeletal: Normal range of motion. No signs of joint swelling. No difficulty with gait.  Neurological: Alert and oriented. Cranial nerves II-XII intact. Coordination normal. +DTRs bilaterally. Psychiatric: Mood and affect normal. Behavior is normal. Judgment and thought content normal.    BMET    Component Value Date/Time   NA 139 03/27/2012 1315   K 5.1 03/27/2012 1315   CL 103 03/27/2012 1315   CO2 27 03/27/2012 1315   GLUCOSE 83 03/27/2012 1315   BUN 17 03/27/2012 1315   CREATININE 0.6 03/27/2012 1315   CALCIUM 9.3 03/27/2012 1315   GFRNONAA 123.62 06/01/2010 1242   GFRAA 142 11/04/2008 1032    Lipid Panel     Component Value Date/Time   CHOL 201* 03/27/2012 1315   TRIG 63.0 03/27/2012 1315   HDL 79.00 03/27/2012 1315   CHOLHDL 3 03/27/2012 1315   VLDL 12.6 03/27/2012 1315   LDLCALC 112* 11/04/2008 1032    CBC    Component Value Date/Time   WBC 5.6 01/25/2011 0938   RBC 3.81* 01/25/2011 0938   HGB 11.1* 01/25/2011 0938   HCT 32.6* 01/25/2011 0938   PLT 307.0 01/25/2011 0938   MCV 85.4 01/25/2011 0938   MCHC 34.2 01/25/2011 0938   RDW 12.9 01/25/2011 0938   LYMPHSABS 1.7 01/25/2011 0938   MONOABS 0.3 01/25/2011 0938   EOSABS 0.0 01/25/2011 0938   BASOSABS 0.0 01/25/2011 0938    Hgb A1C No results found for this basename: HGBA1C        Assessment & Plan:   Preventative Health Maintenance:  All HM UTD Will have labs done at [facility] Will schedule Mammogram  RTC in 1 year or sooner if needed

## 2013-11-24 ENCOUNTER — Other Ambulatory Visit: Payer: Self-pay | Admitting: Family Medicine

## 2013-11-24 DIAGNOSIS — Z0000 Encounter for general adult medical examination without abnormal findings: Secondary | ICD-10-CM

## 2013-11-24 DIAGNOSIS — R5381 Other malaise: Secondary | ICD-10-CM

## 2013-11-27 LAB — LIPID PANEL
Cholesterol, Total: 200 mg/dL — ABNORMAL HIGH (ref 100–199)
LDL Calculated: 101 mg/dL — ABNORMAL HIGH (ref 0–99)
Triglycerides: 88 mg/dL (ref 0–149)
VLDL Cholesterol Cal: 18 mg/dL (ref 5–40)

## 2013-11-27 LAB — CBC
HCT: 30.3 % — ABNORMAL LOW (ref 34.0–46.6)
Hemoglobin: 9.5 g/dL — ABNORMAL LOW (ref 11.1–15.9)
MCH: 22.7 pg — ABNORMAL LOW (ref 26.6–33.0)
MCHC: 31.4 g/dL — ABNORMAL LOW (ref 31.5–35.7)
MCV: 72 fL — ABNORMAL LOW (ref 79–97)
RBC: 4.19 x10E6/uL (ref 3.77–5.28)
RDW: 14.7 % (ref 12.3–15.4)

## 2013-11-27 LAB — COMPREHENSIVE METABOLIC PANEL
ALT: 14 IU/L (ref 0–32)
AST: 15 IU/L (ref 0–40)
Albumin/Globulin Ratio: 2.1 (ref 1.1–2.5)
Alkaline Phosphatase: 64 IU/L (ref 39–117)
BUN/Creatinine Ratio: 17 (ref 9–23)
BUN: 12 mg/dL (ref 6–24)
CO2: 23 mmol/L (ref 18–29)
Chloride: 99 mmol/L (ref 97–108)
GFR calc Af Amer: 119 mL/min/{1.73_m2} (ref >59)
GFR calc non Af Amer: 103 mL/min/{1.73_m2} (ref >59)
Globulin, Total: 2.1 g/dL (ref 1.5–4.5)
Glucose: 84 mg/dL (ref 65–99)
Potassium: 5.1 mmol/L (ref 3.5–5.2)
Sodium: 138 mmol/L (ref 134–144)
Total Bilirubin: 0.2 mg/dL (ref 0.0–1.2)
Total Protein: 6.6 g/dL (ref 6.0–8.5)

## 2013-11-27 LAB — TSH: TSH: 1.38 u[IU]/mL (ref 0.450–4.500)

## 2013-11-27 LAB — HEMOGLOBIN A1C: Est. average glucose Bld gHb Est-mCnc: 111 mg/dL

## 2013-11-27 LAB — VITAMIN B12: Vitamin B-12: 1077 pg/mL — ABNORMAL HIGH (ref 211–946)

## 2013-12-01 ENCOUNTER — Telehealth: Payer: Self-pay | Admitting: Internal Medicine

## 2013-12-01 NOTE — Telephone Encounter (Signed)
Pt needs a call back from [facility].  She left NO other details.

## 2013-12-01 NOTE — Telephone Encounter (Signed)
Spoke with pt already discussed labs with pt.

## 2013-12-17 ENCOUNTER — Ambulatory Visit
Admission: RE | Admit: 2013-12-17 | Discharge: 2013-12-17 | Disposition: A | Discharge status: Home / Self Care | Payer: BC Managed Care – PPO | Source: Ambulatory Visit | Attending: Internal Medicine | Admitting: Internal Medicine

## 2013-12-17 ENCOUNTER — Other Ambulatory Visit: Payer: Self-pay | Admitting: Internal Medicine

## 2013-12-17 DIAGNOSIS — Z1231 Encounter for screening mammogram for malignant neoplasm of breast: Secondary | ICD-10-CM

## 2013-12-17 DIAGNOSIS — Z0000 Encounter for general adult medical examination without abnormal findings: Secondary | ICD-10-CM

## 2014-11-18 ENCOUNTER — Ambulatory Visit (INDEPENDENT_AMBULATORY_CARE_PROVIDER_SITE_OTHER): Payer: BC Managed Care – PPO | Admitting: Family Medicine

## 2014-11-18 ENCOUNTER — Other Ambulatory Visit (HOSPITAL_COMMUNITY)
Admission: RE | Admit: 2014-11-18 | Discharge: 2014-11-18 | Disposition: A | Discharge status: Home / Self Care | Payer: BC Managed Care – PPO | Source: Ambulatory Visit | Attending: Family Medicine | Admitting: Family Medicine

## 2014-11-18 ENCOUNTER — Encounter: Payer: Self-pay | Admitting: Family Medicine

## 2014-11-18 VITALS — BP 100/66 | HR 85 | Temp 98.5°F | Ht 63.0 in | Wt 149.2 lb

## 2014-11-18 DIAGNOSIS — E669 Obesity, unspecified: Secondary | ICD-10-CM

## 2014-11-18 DIAGNOSIS — Z1239 Encounter for other screening for malignant neoplasm of breast: Secondary | ICD-10-CM

## 2014-11-18 DIAGNOSIS — Z1151 Encounter for screening for human papillomavirus (HPV): Secondary | ICD-10-CM | POA: Diagnosis present

## 2014-11-18 DIAGNOSIS — Z01419 Encounter for gynecological examination (general) (routine) without abnormal findings: Secondary | ICD-10-CM

## 2014-11-18 DIAGNOSIS — R569 Unspecified convulsions: Secondary | ICD-10-CM

## 2014-11-18 DIAGNOSIS — Z0000 Encounter for general adult medical examination without abnormal findings: Secondary | ICD-10-CM

## 2014-11-18 LAB — CBC WITH DIFFERENTIAL/PLATELET
BASOS ABS: 0 10*3/uL (ref 0.0–0.1)
Basophils Relative: 0.6 % (ref 0.0–3.0)
EOS ABS: 0.1 10*3/uL (ref 0.0–0.7)
Eosinophils Relative: 1.3 % (ref 0.0–5.0)
HCT: 35.4 % — ABNORMAL LOW (ref 36.0–46.0)
Hemoglobin: 11.4 g/dL — ABNORMAL LOW (ref 12.0–15.0)
Lymphocytes Relative: 29.3 % (ref 12.0–46.0)
Lymphs Abs: 1.8 10*3/uL (ref 0.7–4.0)
MCHC: 32.2 g/dL (ref 30.0–36.0)
MCV: 83.7 fl (ref 78.0–100.0)
MONOS PCT: 6 % (ref 3.0–12.0)
Monocytes Absolute: 0.4 10*3/uL (ref 0.1–1.0)
NEUTROS ABS: 3.8 10*3/uL (ref 1.4–7.7)
NEUTROS PCT: 62.8 % (ref 43.0–77.0)
PLATELETS: 320 10*3/uL (ref 150.0–400.0)
RBC: 4.23 Mil/uL (ref 3.87–5.11)
RDW: 13.1 % (ref 11.5–15.5)
WBC: 6 10*3/uL (ref 4.0–10.5)

## 2014-11-18 LAB — TSH: TSH: 1.31 u[IU]/mL (ref 0.35–4.50)

## 2014-11-18 LAB — COMPREHENSIVE METABOLIC PANEL
ALT: 16 U/L (ref 0–35)
AST: 20 U/L (ref 0–37)
Albumin: 4 g/dL (ref 3.5–5.2)
Alkaline Phosphatase: 55 U/L (ref 39–117)
BUN: 14 mg/dL (ref 6–23)
CO2: 29 mEq/L (ref 19–32)
CREATININE: 0.7 mg/dL (ref 0.4–1.2)
Calcium: 8.8 mg/dL (ref 8.4–10.5)
Chloride: 101 mEq/L (ref 96–112)
GFR: 104.08 mL/min (ref >60.00)
Glucose, Bld: 66 mg/dL — ABNORMAL LOW (ref 70–99)
Potassium: 3.7 mEq/L (ref 3.5–5.1)
SODIUM: 136 meq/L (ref 135–145)
TOTAL PROTEIN: 6.7 g/dL (ref 6.0–8.3)
Total Bilirubin: 0.5 mg/dL (ref 0.2–1.2)

## 2014-11-18 LAB — LIPID PANEL
CHOL/HDL RATIO: 3
Cholesterol: 217 mg/dL — ABNORMAL HIGH (ref 0–200)
HDL: 65 mg/dL (ref >39.00)
LDL Cholesterol: 131 mg/dL — ABNORMAL HIGH (ref 0–99)
NonHDL: 152
Triglycerides: 106 mg/dL (ref 0.0–149.0)
VLDL: 21.2 mg/dL (ref 0.0–40.0)

## 2014-11-18 LAB — HEMOGLOBIN A1C: Hgb A1c MFr Bld: 5.2 % (ref 4.6–6.5)

## 2014-11-18 NOTE — Progress Notes (Signed)
Pre visit review using our clinic review tool, if applicable. No additional management support is needed unless otherwise documented below in the visit note. 

## 2014-11-18 NOTE — Progress Notes (Signed)
48 yo here for CPX.  Doing well- son is now 9 and excelling in school. 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  Seizure disorder- has not had a seizure in years, followed by neurology.  Lab Results  Component Value Date   PHENYTOIN 17.9 03/27/2012   Mammogram 01/06/14- done by me. No h/o abnormal mammograms. Td 11/03/09 Already had flu shot this year.  Working on diet, continuing to do [employer].  Wt Readings from Last 3 Encounters:  11/18/14 149 lb 4 oz (67.699 kg)  11/12/13 149 lb (67.586 kg)  06/12/12 145 lb (65.772 kg)     Lab Results  Component Value Date   CHOL 201* 03/27/2012   HDL 81 11/26/2013   LDLCALC 101* 11/26/2013   LDLDIRECT 107.7 03/27/2012   TRIG 88 11/26/2013   CHOLHDL 2.5 11/26/2013    Patient Active Problem List   Diagnosis Date Noted  . Well woman exam 11/18/2014  . Obesity 10/16/2007  . BREAST MASSES, BILATERAL 10/16/2007  . Convulsions 04/29/2007   Past Medical History  Diagnosis Date  . Mass of breast     bilateral  . Edema     dependent edema of legs, bilateral  . Encounter for long-term (current) use of other medications   . Abnormal mammogram, unspecified   . Obesity, unspecified   . Palpitations   . Routine general medical examination at a health care facility   . Seizure disorder   . Specified congenital anomalies of breast   . Unspecified hypertrophic and atrophic condition of skin    Past Surgical History  Procedure Laterality Date  . Elbow surgery      right, as a child  . Hemorrhoid surgery     History  Substance Use Topics  . Smoking status: Never Smoker   . Smokeless tobacco: Not on file  . Alcohol Use: Not on file   Family History  Problem Relation Age of Onset  . Arthritis Mother     knees  . Diabetes Maternal Grandmother   . Cancer Cousin     maternal cousin, colon cancer   No Known Allergies Current Outpatient Prescriptions on File Prior to Visit  Medication Sig Dispense Refill  . primidone (MYSOLINE) 50 MG tablet  Take 25 mg by mouth 2 (two) times daily.     No current facility-administered medications on file prior to visit.   The PMH, PSH, Social History, Family History, Medications, and allergies have been reviewed in [facility], and have been updated if relevant.   Review of Systems  See HPI  Patient reports no  vision/ hearing changes,anorexia, weight change, fever ,adenopathy, persistant / recurrent hoarseness, swallowing issues, chest pain, edema,persistant / recurrent cough, hemoptysis, dyspnea(rest, exertional, paroxysmal nocturnal), gastrointestinal  bleeding (melena, rectal bleeding), abdominal pain, excessive heart burn, GU symptoms(dysuria, hematuria, pyuria, voiding/incontinence  Issues) syncope, focal weakness, severe memory loss, concerning skin lesions, depression, anxiety, abnormal bruising/bleeding, major joint swelling, breast masses or abnormal vaginal bleeding.    Physical Exam  BP 100/66 mmHg  Pulse 85  Temp(Src) 98.5 F (36.9 C) (Oral)  Ht 5\' 3"  (1.6 m)  Wt 149 lb 4 oz (67.699 kg)  BMI 26.44 kg/m2  SpO2 98%  LMP 10/25/2014 (Within Weeks) Wt Readings from Last 3 Encounters:  11/18/14 149 lb 4 oz (67.699 kg)  11/12/13 149 lb (67.586 kg)  06/12/12 145 lb (65.772 kg)    General:  Well-developed,well-nourished,in no acute distress; alert,appropriate and cooperative throughout examination Head:  normocephalic and atraumatic.   Eyes:  vision grossly  intact, pupils equal, pupils round, and pupils reactive to light.   Ears:  R ear normal and L ear normal.   Nose:  no external deformity.   Mouth:  good dentition.   Neck:  No deformities, masses, or tenderness noted. Breasts:  No mass, nodules, thickening, tenderness, bulging, retraction, inflamation, nipple discharge or skin changes noted.   Lungs:  Normal respiratory effort, chest expands symmetrically. Lungs are clear to auscultation, no crackles or wheezes. Heart:  Normal rate and regular rhythm. S1 and S2 normal without gallop,  murmur, click, rub or other extra sounds. Abdomen:  Bowel sounds positive,abdomen soft and non-tender without masses, organomegaly or hernias noted. Rectal:  no external abnormalities.   Genitalia:  Pelvic Exam:        External: normal female genitalia without lesions or masses        Vagina: normal without lesions or masses        Cervix: normal without lesions or masses        Adnexa: normal bimanual exam without masses or fullness        Uterus: normal by palpation        Pap smear: performed Msk:  No deformity or scoliosis noted of thoracic or lumbar spine.   Extremities:  No clubbing, cyanosis, edema, or deformity noted with normal full range of motion of all joints.  Neurologic:  alert & oriented X3 and gait normal.   Skin:  Intact without suspicious lesions or rashes, multiple nevi, non appear dysplastic. Cervical Nodes:  No lymphadenopathy noted Axillary Nodes:  No palpable lymphadenopathy Psych:  Cognition and judgment appear intact. Alert and cooperative with normal attention span and concentration. No apparent delusions, illusions, hallucinations

## 2014-11-18 NOTE — Assessment & Plan Note (Signed)
Followed by neuro 

## 2014-11-18 NOTE — Assessment & Plan Note (Signed)
Reviewed preventive care protocols, scheduled due services, and updated immunizations Discussed nutrition, exercise, diet, and healthy lifestyle.  Pap smear done today.  Orders Placed This Encounter  Procedures  . CBC with Differential  . Comprehensive metabolic panel  . Lipid panel  . TSH

## 2014-11-18 NOTE — Addendum Note (Signed)
Addended by: Lucille Passy on: 11/18/2014 08:50 AM   Modules accepted: Orders, SmartSet

## 2014-11-18 NOTE — Addendum Note (Signed)
Addended by: Lucille Passy on: 11/18/2014 08:52 AM   Modules accepted: Orders, SmartSet

## 2014-11-18 NOTE — Patient Instructions (Signed)
It was great to see you. Happy Thanksgiving. We will call you with your lab results and you can view them online.

## 2014-11-18 NOTE — Addendum Note (Signed)
Addended by: Modena Nunnery on: 11/18/2014 08:48 AM   Modules accepted: Orders

## 2014-11-19 LAB — PRIMIDONE, SERUM
Phenobarbital Lvl: <2 ug/mL — ABNORMAL LOW (ref 15–40)
Primidone Lvl: 2.9 ug/mL — ABNORMAL LOW (ref 5.0–12.0)

## 2014-11-23 LAB — CYTOLOGY - PAP

## 2014-11-25 ENCOUNTER — Telehealth: Payer: Self-pay | Admitting: Family Medicine

## 2014-11-25 NOTE — Telephone Encounter (Signed)
Patient called with the fax number for Dr.Hemang Shah,Neurologist. [phone].  Patient asked for you to send a copy of her lab work to him.

## 2014-11-25 NOTE — Telephone Encounter (Signed)
Information faxed as requested.

## 2014-12-07 ENCOUNTER — Other Ambulatory Visit: Payer: Self-pay

## 2014-12-07 DIAGNOSIS — Z1231 Encounter for screening mammogram for malignant neoplasm of breast: Secondary | ICD-10-CM

## 2014-12-24 ENCOUNTER — Ambulatory Visit
Admission: RE | Admit: 2014-12-24 | Discharge: 2014-12-24 | Disposition: A | Discharge status: Home / Self Care | Payer: BC Managed Care – PPO | Source: Ambulatory Visit

## 2014-12-24 ENCOUNTER — Encounter (INDEPENDENT_AMBULATORY_CARE_PROVIDER_SITE_OTHER): Payer: Self-pay

## 2014-12-24 DIAGNOSIS — Z1231 Encounter for screening mammogram for malignant neoplasm of breast: Secondary | ICD-10-CM

## 2015-01-06 DIAGNOSIS — G250 Essential tremor: Secondary | ICD-10-CM | POA: Insufficient documentation

## 2015-01-06 DIAGNOSIS — G40309 Generalized idiopathic epilepsy and epileptic syndromes, not intractable, without status epilepticus: Secondary | ICD-10-CM | POA: Insufficient documentation

## 2015-07-20 ENCOUNTER — Telehealth: Payer: Self-pay | Admitting: Family Medicine

## 2015-07-20 ENCOUNTER — Encounter: Payer: Self-pay | Admitting: Family Medicine

## 2015-07-20 ENCOUNTER — Ambulatory Visit (INDEPENDENT_AMBULATORY_CARE_PROVIDER_SITE_OTHER): Payer: BLUE CROSS/BLUE SHIELD | Admitting: Family Medicine

## 2015-07-20 VITALS — BP 98/68 | HR 79 | Temp 97.9°F | Wt 152.8 lb

## 2015-07-20 DIAGNOSIS — N920 Excessive and frequent menstruation with regular cycle: Secondary | ICD-10-CM | POA: Diagnosis not present

## 2015-07-20 DIAGNOSIS — N939 Abnormal uterine and vaginal bleeding, unspecified: Secondary | ICD-10-CM

## 2015-07-20 LAB — CBC WITH DIFFERENTIAL/PLATELET
Basophils Absolute: 0.1 10*3/uL (ref 0.0–0.1)
Basophils Relative: 0.7 % (ref 0.0–3.0)
Eosinophils Absolute: 0.2 10*3/uL (ref 0.0–0.7)
Eosinophils Relative: 2.2 % (ref 0.0–5.0)
HCT: 27.2 % — ABNORMAL LOW (ref 36.0–46.0)
Lymphocytes Relative: 25.3 % (ref 12.0–46.0)
Lymphs Abs: 1.8 10*3/uL (ref 0.7–4.0)
MCHC: 32.2 g/dL (ref 30.0–36.0)
MCV: 70.8 fl — ABNORMAL LOW (ref 78.0–100.0)
MONOS PCT: 5.2 % (ref 3.0–12.0)
Monocytes Absolute: 0.4 10*3/uL (ref 0.1–1.0)
NEUTROS ABS: 4.6 10*3/uL (ref 1.4–7.7)
NEUTROS PCT: 66.6 % (ref 43.0–77.0)
PLATELETS: 347 10*3/uL (ref 150.0–400.0)
RBC: 3.84 Mil/uL — ABNORMAL LOW (ref 3.87–5.11)
RDW: 15.9 % — ABNORMAL HIGH (ref 11.5–15.5)
WBC: 7 10*3/uL (ref 4.0–10.5)

## 2015-07-20 LAB — IBC PANEL
Iron: 10 ug/dL — ABNORMAL LOW (ref 42–145)
Saturation Ratios: 1.6 % — ABNORMAL LOW (ref 20.0–50.0)
Transferrin: 445 mg/dL — ABNORMAL HIGH (ref 212.0–360.0)

## 2015-07-20 LAB — FOLLICLE STIMULATING HORMONE: FSH: 14 m[IU]/mL

## 2015-07-20 NOTE — Telephone Encounter (Signed)
Pt has appt with Dr Damita Dunnings on 07/20/15 at 11:45 AM.

## 2015-07-20 NOTE — Telephone Encounter (Signed)
Patient Name: Christine Saunders  DOB: 24-Aug-1966    Initial Comment Caller states cycles are very heavy, starting to feel weakness and dizzy. ( 2nd number is work number, must ask for her ) primary number is her home, she is not at home.    Nurse Assessment  Nurse: Raphael Gibney, RN, Vanita Ingles Date/Time (Eastern Time): 07/20/2015 10:58:43 AM  Confirm and document reason for call. If symptomatic, describe symptoms. ---Caller states she is having heavy periods. She is weak and tired. She has been dizzy. She is on her period today. She was dizzy earlier. No pain. Has had heavier periods about a yr. Periods have been lasting 4-5 days. She is changing her pad every 2 hrs.  Has the patient traveled out of the country within the last 30 days? ---No  Does the patient require triage? ---Yes  Related visit to physician within the last 2 weeks? ---No  Does the PT have any chronic conditions? (i.e. diabetes, asthma, etc.) ---Yes  List chronic conditions. ---seizures  Did the patient indicate they were pregnant? ---No     Guidelines    Guideline Title Affirmed Question Affirmed Notes  Vaginal Bleeding - Abnormal Pale skin (pallor) of new onset or worsening    Final Disposition User   See Physician within 4 Hours (or PCP triage) Raphael Gibney, RN, [place]    Comments  Attempted to return call and woman who answered phone states that she is with a pt. Will try back in a few min.  Pt states she needs appt around 12:15 as she is seeing pts. No appts available. Called back line at office and spoke to [place] as per pt request and she states Dr. Phillip Heal has appt at 11:45 am and she will book the appt. Pt notified and states she will take the appt.  Has been on her period x 2 days.   Referrals  REFERRED TO PCP OFFICE   Disagree/Comply: Comply

## 2015-07-20 NOTE — Progress Notes (Signed)
Pre visit review using our clinic review tool, if applicable. No additional management support is needed unless otherwise documented below in the visit note.  Heavy periods, but still regular.  Noted over the last year.  She has noted troubles with exertion, she was worried about anemia.  She can get lightheaded.   Mother had menopause at similar age.   No other bruising or bleeding.   No FCNAVD.  She is tried occ.   No abd pain, no cramping.    Meds, vitals, and allergies reviewed.   ROS: See HPI.  Otherwise, noncontributory.  nad ncat Mmm Neck supple rrr ctab abd soft, not ttp Skin still well perfused.

## 2015-07-20 NOTE — Patient Instructions (Signed)
Go to the lab on the way out.  We'll contact you with your lab report. Take care.  Glad to see you.  

## 2015-07-21 ENCOUNTER — Telehealth: Payer: Self-pay | Admitting: Family Medicine

## 2015-07-21 DIAGNOSIS — N920 Excessive and frequent menstruation with regular cycle: Secondary | ICD-10-CM | POA: Insufficient documentation

## 2015-07-21 NOTE — Assessment & Plan Note (Signed)
Pelvic exam deferred, d/w pt.   She is anemic, down to ~9.  She doesn't need transfusion but I would limit exercise/exertion for now.  I would start otc iron 325mg  a day, she may need a stool softener with the iron.  Assuming her period stops and she doesn't have bleeding between periods, she may not need anything done urgently.   If she has continued bleeding, then update [place].   I'll route to Dr. Deborra Medina in the meantime for consideration of longer term options for her anemia.  I didn't put in the f/u CBC/iron studies yet, I'll defer that to PCP.

## 2015-07-21 NOTE — Telephone Encounter (Signed)
Lab results given to patient then forwarded to PCP Dr. Deborra Medina.

## 2015-07-21 NOTE — Telephone Encounter (Signed)
Pt called regarding labs. Today a callback number is [phone], tomorrow a call back number is [phone]

## 2015-08-18 ENCOUNTER — Ambulatory Visit (INDEPENDENT_AMBULATORY_CARE_PROVIDER_SITE_OTHER): Payer: BLUE CROSS/BLUE SHIELD | Admitting: Family Medicine

## 2015-08-18 ENCOUNTER — Ambulatory Visit
Admission: RE | Admit: 2015-08-18 | Discharge: 2015-08-18 | Disposition: A | Discharge status: Home / Self Care | Payer: BLUE CROSS/BLUE SHIELD | Source: Ambulatory Visit | Attending: Family Medicine | Admitting: Family Medicine

## 2015-08-18 ENCOUNTER — Telehealth: Payer: Self-pay | Admitting: Family Medicine

## 2015-08-18 ENCOUNTER — Encounter: Payer: Self-pay | Admitting: Family Medicine

## 2015-08-18 ENCOUNTER — Ambulatory Visit: Payer: BLUE CROSS/BLUE SHIELD | Admitting: Family Medicine

## 2015-08-18 VITALS — BP 116/62 | HR 71 | Temp 98.1°F | Wt 152.2 lb

## 2015-08-18 DIAGNOSIS — N920 Excessive and frequent menstruation with regular cycle: Secondary | ICD-10-CM

## 2015-08-18 DIAGNOSIS — D500 Iron deficiency anemia secondary to blood loss (chronic): Secondary | ICD-10-CM | POA: Diagnosis not present

## 2015-08-18 DIAGNOSIS — D251 Intramural leiomyoma of uterus: Secondary | ICD-10-CM | POA: Diagnosis not present

## 2015-08-18 LAB — CBC WITH DIFFERENTIAL/PLATELET
BASOS PCT: 0.4 % (ref 0.0–3.0)
Basophils Absolute: 0 10*3/uL (ref 0.0–0.1)
EOS ABS: 0.1 10*3/uL (ref 0.0–0.7)
Eosinophils Relative: 1.7 % (ref 0.0–5.0)
HEMATOCRIT: 35.3 % — AB (ref 36.0–46.0)
Hemoglobin: 11.4 g/dL — ABNORMAL LOW (ref 12.0–15.0)
LYMPHS ABS: 1.6 10*3/uL (ref 0.7–4.0)
LYMPHS PCT: 28.8 % (ref 12.0–46.0)
MCHC: 32.3 g/dL (ref 30.0–36.0)
MCV: 79.9 fl (ref 78.0–100.0)
Monocytes Absolute: 0.3 10*3/uL (ref 0.1–1.0)
Monocytes Relative: 5.6 % (ref 3.0–12.0)
NEUTROS ABS: 3.6 10*3/uL (ref 1.4–7.7)
NEUTROS PCT: 63.5 % (ref 43.0–77.0)
PLATELETS: 282 10*3/uL (ref 150.0–400.0)
RBC: 4.42 Mil/uL (ref 3.87–5.11)
RDW: 25.8 % — AB (ref 11.5–15.5)
WBC: 5.6 10*3/uL (ref 4.0–10.5)

## 2015-08-18 LAB — FERRITIN: Ferritin: 20.6 ng/mL (ref 10.0–291.0)

## 2015-08-18 NOTE — Telephone Encounter (Signed)
Returned patient's call and spoke with her about results.

## 2015-08-18 NOTE — Progress Notes (Signed)
Subjective:   Patient ID: Christine Saunders, female    DOB: December 14, 1966, 49 y.o.   MRN: [id number]  Christine Saunders is a pleasant 49 y.o. year old female who presents to clinic today with Follow-up  on 08/18/2015  HPI:  Saw Dr. Damita Dunnings on 7/26 for menorrhagia- note reviewed.  Heavy periods for past year.  Was having DOE and fatigue.  Lab Results  Component Value Date   WBC 7.0 07/20/2015   HGB 8.8 Repeated and verified X2.* 07/20/2015   HCT 27.2* 07/20/2015   MCV 70.8* 07/20/2015   PLT 347.0 07/20/2015   Lab Results  Component Value Date   IRON 10* 07/20/2015    Dr. Damita Dunnings started her on iron 325 mg daily.  Feels better already.  No GI upset or constipation from the iron.  Period has been heavy but regular for past year.  She has not wanted further work up or OCPs since her mom started menopause around her age.  She feels like is likely perimenopausal and wants to "ride it out."  Current Outpatient Prescriptions on File Prior to Visit  Medication Sig Dispense Refill  . ferrous sulfate 325 (65 FE) MG EC tablet Take 325 mg by mouth daily with breakfast.    . folic acid (FOLVITE) 1 MG tablet Take 1 mg by mouth daily.    . primidone (MYSOLINE) 50 MG tablet Take 50 mg by mouth 2 (two) times daily.      No current facility-administered medications on file prior to visit.    No Known Allergies  Past Medical History  Diagnosis Date  . Mass of breast     bilateral  . Edema     dependent edema of legs, bilateral  . Encounter for long-term (current) use of other medications   . Abnormal mammogram, unspecified   . Obesity, unspecified   . Palpitations   . Routine general medical examination at a health care facility   . Seizure disorder   . Specified congenital anomalies of breast   . Unspecified hypertrophic and atrophic condition of skin     Past Surgical History  Procedure Laterality Date  . Elbow surgery      right, as a child  . Hemorrhoid surgery      Family History    Problem Relation Age of Onset  . Arthritis Mother     knees  . Diabetes Maternal Grandmother   . Cancer Cousin     maternal cousin, colon cancer    Social History   Social History  . Marital Status: Married    Spouse Name: N/A  . Number of Children: 1  . 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 Smoking status: Never Smoker   . Smokeless tobacco: Never Used  . Alcohol Use: No  . Drug Use: No  . Sexual Activity: Not on file   Other Topics Concern  . Not on file   Social History Narrative   Born in [place].   The PMH, PSH, Social History, Family History, Medications, and allergies have been reviewed in [facility], and have been updated if relevant.  Review of Systems  Constitutional: Positive for fatigue.  HENT: Negative.   Eyes: Negative.   Respiratory: Negative.   Cardiovascular: Negative.   Gastrointestinal: Negative.   Endocrine: Negative.   Musculoskeletal: Negative.   Skin: Negative.   Neurological: Negative.   Hematological: Negative.   Psychiatric/Behavioral:  Negative.   All other systems reviewed and are negative.      Objective:    BP 116/62 mmHg  Pulse 71  Temp(Src) 98.1 F (36.7 C) (Oral)  Wt 152 lb 4 oz (69.06 kg)  SpO2 100%  LMP 07/19/2015   Physical Exam  Constitutional: She is oriented to person, place, and time. She appears well-developed and well-nourished.  HENT:  Head: Normocephalic and atraumatic.  Eyes: Conjunctivae are normal.  Neck: Normal range of motion.  Cardiovascular: Normal rate and regular rhythm.   Pulmonary/Chest: Effort normal and breath sounds normal.  Abdominal: Soft.  Musculoskeletal: She exhibits no edema.  Neurological: She is alert and oriented to person, place, and time. No cranial nerve deficit.  Skin: Skin is warm and dry.  Nursing note and vitals reviewed.         Assessment & Plan:   Menorrhagia with regular cycle  Anemia due to  chronic blood loss No Follow-up on file.

## 2015-08-18 NOTE — Patient Instructions (Signed)
Great to see you. I will call you with your lab results.  Please stop by to see Christine Saunders on your way out to set up your ultrasound.

## 2015-08-18 NOTE — Progress Notes (Signed)
Pre visit review using our clinic review tool, if applicable. No additional management support is needed unless otherwise documented below in the visit note. 

## 2015-08-18 NOTE — Telephone Encounter (Signed)
Patient returned Dr.Aron's call.  Patient is asking to be called back on her cell phone.  If she's not available, please leave a good time to call you back.

## 2015-08-18 NOTE — Assessment & Plan Note (Signed)
Continue iron. Recheck iron studies today. Likely will continue current dose of iron until her heavy bleeding resolves. The patient indicates understanding of these issues and agrees with the plan.

## 2015-08-18 NOTE — Assessment & Plan Note (Signed)
Persistent. She is deferring GYN referral at this time but does agree to pelvic/transvaginal ultrasounds. Orders entered.

## 2015-11-16 ENCOUNTER — Other Ambulatory Visit: Payer: Self-pay

## 2015-11-16 DIAGNOSIS — Z1231 Encounter for screening mammogram for malignant neoplasm of breast: Secondary | ICD-10-CM

## 2015-12-15 ENCOUNTER — Encounter: Payer: Self-pay | Admitting: Family Medicine

## 2015-12-15 ENCOUNTER — Ambulatory Visit (INDEPENDENT_AMBULATORY_CARE_PROVIDER_SITE_OTHER): Payer: BLUE CROSS/BLUE SHIELD | Admitting: Family Medicine

## 2015-12-15 VITALS — BP 110/70 | HR 79 | Temp 97.9°F | Ht 63.25 in | Wt 154.2 lb

## 2015-12-15 DIAGNOSIS — R569 Unspecified convulsions: Secondary | ICD-10-CM

## 2015-12-15 DIAGNOSIS — Z0000 Encounter for general adult medical examination without abnormal findings: Secondary | ICD-10-CM | POA: Diagnosis not present

## 2015-12-15 DIAGNOSIS — D259 Leiomyoma of uterus, unspecified: Secondary | ICD-10-CM | POA: Insufficient documentation

## 2015-12-15 DIAGNOSIS — D500 Iron deficiency anemia secondary to blood loss (chronic): Secondary | ICD-10-CM

## 2015-12-15 DIAGNOSIS — Z01419 Encounter for gynecological examination (general) (routine) without abnormal findings: Secondary | ICD-10-CM

## 2015-12-15 DIAGNOSIS — N920 Excessive and frequent menstruation with regular cycle: Secondary | ICD-10-CM

## 2015-12-15 NOTE — Progress Notes (Signed)
49 yo here for CPX.  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  Seizure disorder- has not had a seizure in years, followed by neurology.  Lab Results  Component Value Date   PHENYTOIN 17.9 03/27/2012   PHENOBARB <2* 11/18/2014   Mammogram scheduled for 12/29/15 No h/o abnormal mammograms. Td 11/03/09 Already had flu shot this year. Last pap smear 11/18/14- done by me.  Working on diet, continuing to do [employer].  Wt Readings from Last 3 Encounters:  12/15/15 154 lb 4 oz (69.967 kg)  08/18/15 152 lb 4 oz (69.06 kg)  07/20/15 152 lb 12 oz (69.287 kg)   Saw her in August 2016 for menorrhagia. Pelvic US showed multiple uterine fibroids, dominant 5.7 cm. Deferred GYN referral.   Bleeding is still quite heavy.  Lab Results  Component Value Date   CHOL 217* 11/18/2014   HDL 65.00 11/18/2014   LDLCALC 131* 11/18/2014   LDLDIRECT 107.7 03/27/2012   TRIG 106.0 11/18/2014   CHOLHDL 3 11/18/2014    Patient Active Problem List   Diagnosis Date Noted  . Uterine fibroid 12/15/2015  . Anemia due to chronic blood loss 08/18/2015  . Heavy periods 07/21/2015  . Well woman exam 11/18/2014  . Obesity 10/16/2007  . BREAST MASSES, BILATERAL 10/16/2007  . Convulsions ([place]) 04/29/2007   Past Medical History  Diagnosis Date  . Mass of breast     bilateral  . Edema     dependent edema of legs, bilateral  . Encounter for long-term (current) use of other medications   . Abnormal mammogram, unspecified   . Obesity, unspecified   . Palpitations   . Routine general medical examination at a health care facility   . Seizure disorder ([place])   . Specified congenital anomalies of breast   . Unspecified hypertrophic and atrophic condition of skin    Past Surgical History  Procedure Laterality Date  . Elbow surgery      right, as a child  . Hemorrhoid surgery     Social History  Substance Use Topics  . Smoking status: Never Smoker   . Smokeless tobacco: Never Used  .  Alcohol Use: No   Family History  Problem Relation Age of Onset  . Arthritis Mother     knees  . Diabetes Maternal Grandmother   . Cancer Cousin     maternal cousin, colon cancer   No Known Allergies Current Outpatient Prescriptions on File Prior to Visit  Medication Sig Dispense Refill  . ferrous sulfate 325 (65 FE) MG EC tablet Take 325 mg by mouth daily with breakfast.    . folic acid (FOLVITE) 1 MG tablet Take 1 mg by mouth daily.    . primidone (MYSOLINE) 50 MG tablet Take 50 mg by mouth 2 (two) times daily.      No current facility-administered medications on file prior to visit.   The PMH, PSH, Social History, Family History, Medications, and allergies have been reviewed in [facility], and have been updated if relevant.   Review of Systems  Constitutional: Positive for fatigue. Negative for fever and unexpected weight change.  Respiratory: Negative.   Cardiovascular: Negative.   Gastrointestinal: Negative.   Endocrine: Negative.   Genitourinary: Positive for menstrual problem.  Musculoskeletal: Negative.   Skin: Negative.   Allergic/Immunologic: Negative.   Neurological: Negative.   Hematological: Negative.   Psychiatric/Behavioral: Negative.   All other systems reviewed and are negative.      Physical Exam  BP  110/70 mmHg  Pulse 79  Temp(Src) 97.9 F (36.6 C) (Oral)  Ht 5' 3.25" (1.607 m)  Wt 154 lb 4 oz (69.967 kg)  BMI 27.09 kg/m2  SpO2 97%  LMP 11/24/2015 (Within Weeks) Wt Readings from Last 3 Encounters:  12/15/15 154 lb 4 oz (69.967 kg)  08/18/15 152 lb 4 oz (69.06 kg)  07/20/15 152 lb 12 oz (69.287 kg)    General:  Well-developed,well-nourished,in no acute distress; alert,appropriate and cooperative throughout examination Head:  normocephalic and atraumatic.   Eyes:  vision grossly intact, pupils equal, pupils round, and pupils reactive to light.   Ears:  R ear normal and L ear normal.   Nose:  no external deformity.   Mouth:  good dentition.    Neck:  No deformities, masses, or tenderness noted. Breasts:  No mass, nodules, thickening, tenderness, bulging, retraction, inflamation, nipple discharge or skin changes noted.   Lungs:  Normal respiratory effort, chest expands symmetrically. Lungs are clear to auscultation, no crackles or wheezes. Heart:  Normal rate and regular rhythm. S1 and S2 normal without gallop, murmur, click, rub or other extra sounds. Abdomen:  Bowel sounds positive,abdomen soft and non-tender without masses, organomegaly or hernias noted. Rectal:  no external abnormalities.   Genitalia:  Pelvic Exam:        External: normal female genitalia without lesions or masses        Vagina: normal without lesions or masses        Cervix: normal without lesions or masses        Adnexa: normal bimanual exam without masses or fullness        Uterus: normal by palpation        Pap smear: performed Msk:  No deformity or scoliosis noted of thoracic or lumbar spine.   Extremities:  No clubbing, cyanosis, edema, or deformity noted with normal full range of motion of all joints.  Neurologic:  alert & oriented X3 and gait normal.   Skin:  Intact without suspicious lesions or rashes, multiple nevi, non appear dysplastic. Cervical Nodes:  No lymphadenopathy noted Axillary Nodes:  No palpable lymphadenopathy Psych:  Cognition and judgment appear intact. Alert and cooperative with normal attention span and concentration. No apparent delusions, illusions, hallucinations

## 2015-12-15 NOTE — Addendum Note (Signed)
Addended by: Modena Nunnery on: 12/15/2015 08:48 AM   Modules accepted: Orders

## 2015-12-15 NOTE — Assessment & Plan Note (Signed)
Followed by neuro 

## 2015-12-15 NOTE — Assessment & Plan Note (Signed)
Recheck CBC today. 

## 2015-12-15 NOTE — Progress Notes (Signed)
Pre visit review using our clinic review tool, if applicable. No additional management support is needed unless otherwise documented below in the visit note. 

## 2015-12-15 NOTE — Addendum Note (Signed)
Addended by: Marchia Bond on: 12/15/2015 09:59 AM   Modules accepted: Orders, SmartSet

## 2015-12-15 NOTE — Assessment & Plan Note (Signed)
Recheck CBC today.  She is considering treatment based on these results.

## 2015-12-15 NOTE — Assessment & Plan Note (Signed)
Reviewed preventive care protocols, scheduled due services, and updated immunizations Discussed nutrition, exercise, diet, and healthy lifestyle.  Orders Placed This Encounter  Procedures  . CBC with Differential/Platelet  . Comprehensive metabolic panel  . Lipid panel  . TSH  . Vitamin D, 25-hydroxy  . Vitamin B12  . Hemoglobin A1c    Discussed USPSTF recommendations of cervical cancer screening.  She is aware that interval of 3 years is recommended but pt would prefer to have pap smear done today.

## 2015-12-15 NOTE — Patient Instructions (Signed)
Great to see you. Happy Holidays!  We will call you with your results from today. 

## 2015-12-16 ENCOUNTER — Other Ambulatory Visit: Payer: Self-pay | Admitting: Family Medicine

## 2015-12-16 LAB — COMPREHENSIVE METABOLIC PANEL
ALBUMIN: 4.1 g/dL (ref 3.5–5.5)
ALT: 12 IU/L (ref 0–32)
AST: 16 IU/L (ref 0–40)
Albumin/Globulin Ratio: 1.9 (ref 1.1–2.5)
Alkaline Phosphatase: 71 IU/L (ref 39–117)
BUN / CREAT RATIO: 19 (ref 9–23)
BUN: 12 mg/dL (ref 6–24)
Bilirubin Total: 0.2 mg/dL (ref 0.0–1.2)
CHLORIDE: 101 mmol/L (ref 96–106)
CO2: 24 mmol/L (ref 18–29)
Calcium: 9.2 mg/dL (ref 8.7–10.2)
Creatinine, Ser: 0.62 mg/dL (ref 0.57–1.00)
GFR calc Af Amer: 124 mL/min/{1.73_m2} (ref >59)
GFR calc non Af Amer: 108 mL/min/{1.73_m2} (ref >59)
GLOBULIN, TOTAL: 2.2 g/dL (ref 1.5–4.5)
Glucose: 87 mg/dL (ref 65–99)
Potassium: 4.6 mmol/L (ref 3.5–5.2)
SODIUM: 140 mmol/L (ref 134–144)
Total Protein: 6.3 g/dL (ref 6.0–8.5)

## 2015-12-16 LAB — CBC WITH DIFFERENTIAL/PLATELET
Basophils Absolute: 0 10*3/uL (ref 0.0–0.2)
Basos: 1 %
EOS (ABSOLUTE): 0.1 10*3/uL (ref 0.0–0.4)
EOS: 2 %
HEMOGLOBIN: 10.5 g/dL — AB (ref 11.1–15.9)
Hematocrit: 32.6 % — ABNORMAL LOW (ref 34.0–46.6)
Immature Grans (Abs): 0 10*3/uL (ref 0.0–0.1)
Immature Granulocytes: 0 %
LYMPHS ABS: 1.6 10*3/uL (ref 0.7–3.1)
Lymphs: 31 %
MCH: 26.6 pg (ref 26.6–33.0)
MCHC: 32.2 g/dL (ref 31.5–35.7)
MCV: 83 fL (ref 79–97)
MONOCYTES: 7 %
Monocytes Absolute: 0.3 10*3/uL (ref 0.1–0.9)
NEUTROS ABS: 3 10*3/uL (ref 1.4–7.0)
Neutrophils: 59 %
Platelets: 340 10*3/uL (ref 150–379)
RBC: 3.94 x10E6/uL (ref 3.77–5.28)
RDW: 13.2 % (ref 12.3–15.4)
WBC: 5.1 10*3/uL (ref 3.4–10.8)

## 2015-12-16 LAB — HEMOGLOBIN A1C
Est. average glucose Bld gHb Est-mCnc: 100 mg/dL
Hgb A1c MFr Bld: 5.1 % (ref 4.8–5.6)

## 2015-12-16 LAB — LIPID PANEL
Chol/HDL Ratio: 2.9 ratio units (ref 0.0–4.4)
Cholesterol, Total: 217 mg/dL — ABNORMAL HIGH (ref 100–199)
HDL: 76 mg/dL (ref >39)
LDL CALC: 115 mg/dL — AB (ref 0–99)
Triglycerides: 132 mg/dL (ref 0–149)
VLDL Cholesterol Cal: 26 mg/dL (ref 5–40)

## 2015-12-16 LAB — VITAMIN B12: VITAMIN B 12: 529 pg/mL (ref 211–946)

## 2015-12-16 LAB — TSH: TSH: 1.42 u[IU]/mL (ref 0.450–4.500)

## 2015-12-16 LAB — VITAMIN D 25 HYDROXY (VIT D DEFICIENCY, FRACTURES): VIT D 25 HYDROXY: 18.7 ng/mL — AB (ref 30.0–100.0)

## 2015-12-16 MED ORDER — VITAMIN D (ERGOCALCIFEROL) 1.25 MG (50000 UNIT) PO CAPS: 50000.0000 [IU] | ORAL_CAPSULE | ORAL | Status: DC

## 2015-12-16 MED ORDER — NORETHIN ACE-ETH ESTRAD-FE 1-20 MG-MCG(24) PO TABS: 1.0000 | ORAL_TABLET | Freq: Every day | ORAL | Status: DC

## 2015-12-21 ENCOUNTER — Encounter: Payer: Self-pay | Admitting: *Deleted

## 2015-12-21 LAB — PAP LB (LIQUID-BASED): PAP Smear Comment: 0

## 2015-12-29 ENCOUNTER — Ambulatory Visit: Payer: BLUE CROSS/BLUE SHIELD

## 2015-12-31 ENCOUNTER — Telehealth: Payer: Self-pay | Admitting: Family Medicine

## 2015-12-31 NOTE — Telephone Encounter (Signed)
Patient Name: Christine Saunders DOB: Jun 03, 1966 Initial Comment Caller states she had a physical recently, fibroids are growing, rx bc, slowly gaining weight, period has stopped. Nurse Assessment Nurse: Ronnald Ramp, RN, Miranda Date/Time (Eastern Time): 12/31/2015 9:15:53 AM Confirm and document reason for call. If symptomatic, describe symptoms. ---Caller states she has been diagnosed with uterine fibroids and was supposed to start on BCP once her menstrual cycle started but she has not started her cycle. She is 10-14 days late. Has the patient traveled out of the country within the last 30 days? ---Not Applicable Does the patient have any new or worsening symptoms? ---Yes Will a triage be completed? ---Yes Related visit to physician within the last 2 weeks? ---No Does the PT have any chronic conditions? (i.e. diabetes, asthma, etc.) ---No Did the patient indicate they were pregnant? ---No Is this a behavioral health or substance abuse call? ---No Guidelines Guideline Title Affirmed Question Affirmed Notes Menstrual Period - Missed or Late Menstrual period, missed or late (all triage questions negative) Final Disposition User [place], RN, Miranda Comments Caller is wanting to know if there is a concern for the fact she has not started her cycle and if anything needs to be done. She would like a call back from the doctor today. She is working and will available at [phone] most of the day. From 1p-2p or if her office closes early for the snow, she is available at [phone] Disagree/Comply: Comply

## 2015-12-31 NOTE — Telephone Encounter (Signed)
Pt called returning a missed call. You can reach her at [phone].

## 2015-12-31 NOTE — Telephone Encounter (Signed)
Lm on pts vm requesting a call back 

## 2015-12-31 NOTE — Telephone Encounter (Signed)
Agree with Dr. Damita Dunnings.  Please advise pt to call me on Monday with an update.

## 2015-12-31 NOTE — Telephone Encounter (Signed)
It could be that she is perimenopausal and that is why she hasn't started her period.  I would check a home pregnancy test in the meantime.   I'll route to PCP for input about starting OCPs if patient hasn't started menses by Monday.  Thanks.

## 2016-01-03 NOTE — Telephone Encounter (Signed)
Pt returned your call  Best number to call [phone] If you get voice mail please let pt know what would be a good time for her to call you

## 2016-01-03 NOTE — Telephone Encounter (Signed)
Patient notified as instructed by telephone and verbalized understanding. 

## 2016-01-03 NOTE — Telephone Encounter (Signed)
Patient notified as instructed by telephone and verbalized understanding. Patient stated that she has birth control pills, but has not started them yet because the pharmacist told her to start taking them Sunday after she starts her period. Patient stated that she has not started them because she has not started her period. Patient stated that she knows that she is not pregnant. Please advise.

## 2016-01-03 NOTE — Telephone Encounter (Signed)
Ok to go ahead and start them.

## 2016-01-11 ENCOUNTER — Telehealth: Payer: Self-pay

## 2016-01-11 NOTE — Telephone Encounter (Signed)
Left VM for pt to return my call  

## 2016-01-11 NOTE — Telephone Encounter (Signed)
Pt left v/m; Dr Chrisandra Carota has some concerns regarding treatment of fibroid. Pt request cb from Dr Deborra Medina only. Dr Chrisandra Carota can be reached at [phone] and today between 1-2 pm [phone].

## 2016-01-19 ENCOUNTER — Ambulatory Visit
Admission: RE | Admit: 2016-01-19 | Discharge: 2016-01-19 | Disposition: A | Discharge status: Home / Self Care | Payer: BLUE CROSS/BLUE SHIELD | Source: Ambulatory Visit

## 2016-01-19 DIAGNOSIS — Z1231 Encounter for screening mammogram for malignant neoplasm of breast: Secondary | ICD-10-CM

## 2016-01-28 ENCOUNTER — Telehealth: Payer: Self-pay | Admitting: *Deleted

## 2016-01-28 NOTE — Telephone Encounter (Signed)
Pt left voicemail at Triage. Pt was prescribed BCP to help bleeding at her last OV but pt said she has been bleeding for 10 days and yesterday the color changed. Pt said according to her pills she should have started her period yesterday not 10 days ago, pt said she is also feeling really bad due to excessive bleeding, pt said she tried to call Dr. Deborra Medina on her cell but she wasn't able to get through, pt request call back

## 2016-01-28 NOTE — Telephone Encounter (Signed)
Spoke to Dr Deborra Medina via phone, who states that she has spoken with pt, and per their conversation, she is not needing to be seen today. Pt will keep Dr Deborra Medina updated and f/u as needed.

## 2016-01-28 NOTE — Telephone Encounter (Signed)
Attempted to call pt.  No answer.  Please advise pt that I want her to be seen today.

## 2016-03-11 ENCOUNTER — Other Ambulatory Visit: Payer: Self-pay | Admitting: Family Medicine

## 2016-03-13 NOTE — Telephone Encounter (Signed)
Is pt needing to continue high dose? pls advise 

## 2016-11-01 ENCOUNTER — Telehealth: Payer: Self-pay

## 2016-11-01 NOTE — Telephone Encounter (Signed)
Lm on pts vm requesting a call back with update of status 

## 2016-11-01 NOTE — Telephone Encounter (Signed)
Please call to check on pt. 

## 2016-11-01 NOTE — Telephone Encounter (Signed)
PLEASE NOTE: All timestamps contained within this report are represented as [place] Standard Time. CONFIDENTIALTY NOTICE: This fax transmission is intended only for the addressee. It contains information that is legally privileged, confidential or otherwise protected from use or disclosure. If you are not the intended recipient, you are strictly prohibited from reviewing, disclosing, copying using or disseminating any of this information or taking any action in reliance on or regarding this information. If you have received this fax in error, please notify us immediately by telephone so that we can arrange for its return to [place]. Phone: [phone], Toll-Free: [phone], Fax: [phone] Page: 1 of 1 Call Id: [id number] [place] Patient Name: Christine Saunders Gender: Female DOB: [birth year] Age: 50 Y 42 M 7 D Return Phone Number: [phone] (Primary) Address: City/State/Zip: [place] Night - Client Client Site [place] [facility] - PHYSICIAN, NOT LISTED- MD Contact Type Call Who Is Calling Patient / Member / Family / Caregiver Call Type Triage / Clinical Relationship To Patient Self Return Phone Number [phone] (Primary) Chief Complaint Urination Pain Reason for Call Symptomatic / Request for [place] she thinks she has a UTI Translation No Nurse Assessment Nurse: Arthor Captain, RN, Margaret Date/Time (Eastern Time): 10/31/2016 7:41:51 PM Confirm and document reason for call. If symptomatic, describe symptoms. You must click the next button to save text entered. ---caller states burning when peeing. denies fever, yellow urine Has the patient traveled out of the country within the last 30 days? ---No Does the patient have any new or worsening symptoms? ---No Please document clinical  information provided and list any resource used. ---went to urgent care Guidelines Guideline Title Affirmed Question Affirmed Notes Nurse Date/Time (Eastern Time) Disp. Time Eilene Ghazi Time) Disposition Final User 10/31/2016 7:43:59 PM Clinical Call Yes [place], RN, Joycelyn Schmid Comments User: Chinita Pester, RN Date/Time Eilene Ghazi Time): 10/31/2016 7:44:35 PM when nurse called patient back she is sitting in urgent care facility waiting to be seen

## 2016-11-08 ENCOUNTER — Ambulatory Visit (INDEPENDENT_AMBULATORY_CARE_PROVIDER_SITE_OTHER): Payer: BLUE CROSS/BLUE SHIELD | Admitting: Family Medicine

## 2016-11-08 ENCOUNTER — Encounter: Payer: Self-pay | Admitting: Family Medicine

## 2016-11-08 VITALS — BP 102/64 | HR 80 | Temp 98.0°F | Ht 63.5 in | Wt 156.0 lb

## 2016-11-08 DIAGNOSIS — N3090 Cystitis, unspecified without hematuria: Secondary | ICD-10-CM | POA: Insufficient documentation

## 2016-11-08 LAB — POC URINALSYSI DIPSTICK (AUTOMATED)
Bilirubin, UA: NEGATIVE
Glucose, UA: NEGATIVE
Ketones, UA: NEGATIVE
Leukocytes, UA: NEGATIVE
NITRITE UA: NEGATIVE
PH UA: 8
Protein, UA: NEGATIVE
RBC UA: NEGATIVE
Spec Grav, UA: 1.015
UROBILINOGEN UA: 0.2

## 2016-11-08 NOTE — Progress Notes (Signed)
Subjective:    Patient ID: Christine Saunders, female    DOB: 11/28/1966, 50 y.o.   MRN: [id number]  HPI Here for urinary symptoms   11/7 had burning on urination [facility] and [facility] with uti and tx with macrobid and pyridium  Improved the next day  Finished macrobid yesterday   Last day she had symptoms - "peppery" feeling in bladder - yesterday   Right now-no symptoms  Urine was more yellow- now clearer  Drinking lots of water  Some cranberry juice   Knows her cx was positive- got a call from the UC   Results for orders placed or performed in visit on 11/08/16  POCT Urinalysis Dipstick (Automated)  Result Value Ref Range   Color, UA light yellow    Clarity, UA clear    Glucose, UA negative    Bilirubin, UA negative    Ketones, UA negative    Spec Grav, UA 1.015    Blood, UA negative    pH, UA 8.0    Protein, UA negative    Urobilinogen, UA 0.2    Nitrite, UA negative    Leukocytes, UA Negative Negative    Neg ua today  Patient Active Problem List   Diagnosis Date Noted  . Cystitis 11/08/2016  . Uterine fibroid 12/15/2015  . Anemia due to chronic blood loss 08/18/2015  . Heavy periods 07/21/2015  . Well woman exam 11/18/2014  . Obesity 10/16/2007  . BREAST MASSES, BILATERAL 10/16/2007  . Convulsions ([place]) 04/29/2007   Past Medical History:  Diagnosis Date  . Abnormal mammogram, unspecified   . Edema    dependent edema of legs, bilateral  . Encounter for long-term (current) use of other medications   . Mass of breast    bilateral  . Obesity, unspecified   . Palpitations   . Routine general medical examination at a health care facility   . Seizure disorder ([place])   . Specified congenital anomalies of breast   . Unspecified hypertrophic and atrophic condition of skin    Past Surgical History:  Procedure Laterality Date  . ELBOW SURGERY     right, as a child  . HEMORRHOID SURGERY     Social History  Substance Use Topics  . Smoking status: Never  Smoker  . Smokeless tobacco: Never Used  . Alcohol use No   Family History  Problem Relation Age of Onset  . Arthritis Mother     knees  . Diabetes Maternal Grandmother   . Cancer Cousin     maternal cousin, colon cancer   No Known Allergies Current Outpatient Prescriptions on File Prior to Visit  Medication Sig Dispense Refill  . ferrous sulfate 325 (65 FE) MG EC tablet Take 325 mg by mouth daily with breakfast.    . folic acid (FOLVITE) 1 MG tablet Take 1 mg by mouth daily.    . Norethindrone Acetate-Ethinyl Estrad-FE (LOESTRIN 24 FE) 1-20 MG-MCG(24) tablet Take 1 tablet by mouth daily. 1 Package 11  . primidone (MYSOLINE) 50 MG tablet Take 50 mg by mouth 2 (two) times daily.     . Vitamin D, Ergocalciferol, (DRISDOL) 50000 units CAPS capsule TAKE 1 CAPSULE (50,000 UNITS TOTAL) BY MOUTH EVERY 7 (SEVEN) DAYS. (Patient not taking: Reported on 11/08/2016) 12 capsule 0   No current facility-administered medications on file prior to visit.     Review of Systems Review of Systems  Constitutional: Negative for fever, appetite change, fatigue and unexpected weight change.  Eyes:  Negative for pain and visual disturbance.  Respiratory: Negative for cough and shortness of breath.   Cardiovascular: Negative for cp or palpitations    Gastrointestinal: Negative for nausea, diarrhea and constipation.  Genitourinary: Negative for urgency and frequency. neg for dysuria or hematuria or flank pain Skin: Negative for pallor or rash   Neurological: Negative for weakness, light-headedness, numbness and headaches.  Hematological: Negative for adenopathy. Does not bruise/bleed easily.  Psychiatric/Behavioral: Negative for dysphoric mood. The patient is not nervous/anxious.         Objective:   Physical Exam  Constitutional: She appears well-developed and well-nourished. No distress.  HENT:  Head: Normocephalic and atraumatic.  Eyes: Conjunctivae and EOM are normal. Pupils are equal, round, and  reactive to light.  Neck: Normal range of motion. Neck supple.  Cardiovascular: Normal rate, regular rhythm and normal heart sounds.   Pulmonary/Chest: Effort normal and breath sounds normal.  Abdominal: Soft. Bowel sounds are normal. She exhibits no distension. There is no tenderness. There is no rebound.  No cva tenderness  no suprapubic tenderness  Musculoskeletal: She exhibits no edema.  Lymphadenopathy:    She has no cervical adenopathy.  Neurological: She is alert.  Skin: No rash noted.  Psychiatric: She has a normal mood and affect.          Assessment & Plan:   Problem List Items Addressed This Visit      Genitourinary   Cystitis    Treated with macrobid from UC (and pyridium for symptoms) Symptoms are now resolved Had one day of bladder discomfort after tx  ua neg today  Enc water intake -avoid bladder irritants incl spicy foods  Update if symptoms return (would repeat cx)      Relevant Orders   POCT Urinalysis Dipstick (Automated) (Completed)

## 2016-11-08 NOTE — Progress Notes (Signed)
Pre visit review using our clinic review tool, if applicable. No additional management support is needed unless otherwise documented below in the visit note. 

## 2016-11-08 NOTE — Patient Instructions (Signed)
It looks like your uti is resolved (UA is clear) Keep drinking water  Avoid other drinks  If symptoms return please let us know

## 2016-11-09 NOTE — Assessment & Plan Note (Signed)
Treated with macrobid from UC (and pyridium for symptoms) Symptoms are now resolved Had one day of bladder discomfort after tx  ua neg today  Enc water intake -avoid bladder irritants incl spicy foods  Update if symptoms return (would repeat cx)

## 2016-11-20 ENCOUNTER — Other Ambulatory Visit: Payer: Self-pay | Admitting: Family Medicine

## 2016-12-13 ENCOUNTER — Other Ambulatory Visit: Payer: Self-pay | Admitting: Family Medicine

## 2016-12-13 DIAGNOSIS — Z01419 Encounter for gynecological examination (general) (routine) without abnormal findings: Secondary | ICD-10-CM

## 2016-12-19 ENCOUNTER — Other Ambulatory Visit (INDEPENDENT_AMBULATORY_CARE_PROVIDER_SITE_OTHER): Payer: BLUE CROSS/BLUE SHIELD

## 2016-12-19 ENCOUNTER — Other Ambulatory Visit: Payer: Self-pay | Admitting: Family Medicine

## 2016-12-19 DIAGNOSIS — Z01419 Encounter for gynecological examination (general) (routine) without abnormal findings: Secondary | ICD-10-CM

## 2016-12-19 NOTE — Addendum Note (Signed)
Addended by: Marchia Bond on: 12/19/2016 08:16 AM   Modules accepted: Orders

## 2016-12-20 ENCOUNTER — Encounter: Payer: BLUE CROSS/BLUE SHIELD | Admitting: Family Medicine

## 2016-12-20 LAB — CBC WITH DIFFERENTIAL/PLATELET
BASOS ABS: 0 10*3/uL (ref 0.0–0.2)
Basos: 0 %
EOS (ABSOLUTE): 0.1 10*3/uL (ref 0.0–0.4)
Eos: 2 %
HEMATOCRIT: 34.6 % (ref 34.0–46.6)
Hemoglobin: 11.4 g/dL (ref 11.1–15.9)
Immature Grans (Abs): 0 10*3/uL (ref 0.0–0.1)
Immature Granulocytes: 0 %
LYMPHS ABS: 1.6 10*3/uL (ref 0.7–3.1)
Lymphs: 28 %
MCH: 27.6 pg (ref 26.6–33.0)
MCHC: 32.9 g/dL (ref 31.5–35.7)
MCV: 84 fL (ref 79–97)
MONOS ABS: 0.2 10*3/uL (ref 0.1–0.9)
Monocytes: 4 %
Neutrophils Absolute: 3.7 10*3/uL (ref 1.4–7.0)
Neutrophils: 66 %
PLATELETS: 303 10*3/uL (ref 150–379)
RBC: 4.13 x10E6/uL (ref 3.77–5.28)
RDW: 15 % (ref 12.3–15.4)
WBC: 5.7 10*3/uL (ref 3.4–10.8)

## 2016-12-20 LAB — COMPREHENSIVE METABOLIC PANEL
ALK PHOS: 55 IU/L (ref 39–117)
ALT: 12 IU/L (ref 0–32)
AST: 13 IU/L (ref 0–40)
Albumin/Globulin Ratio: 1.7 (ref 1.2–2.2)
Albumin: 4 g/dL (ref 3.5–5.5)
BUN/Creatinine Ratio: 13 (ref 9–23)
BUN: 9 mg/dL (ref 6–24)
Bilirubin Total: <0.2 mg/dL (ref 0.0–1.2)
CO2: 20 mmol/L (ref 18–29)
CREATININE: 0.7 mg/dL (ref 0.57–1.00)
Calcium: 8.9 mg/dL (ref 8.7–10.2)
Chloride: 101 mmol/L (ref 96–106)
GFR calc Af Amer: 118 mL/min/{1.73_m2} (ref >59)
GFR calc non Af Amer: 103 mL/min/{1.73_m2} (ref >59)
GLOBULIN, TOTAL: 2.4 g/dL (ref 1.5–4.5)
Glucose: 89 mg/dL (ref 65–99)
POTASSIUM: 4.4 mmol/L (ref 3.5–5.2)
SODIUM: 139 mmol/L (ref 134–144)
Total Protein: 6.4 g/dL (ref 6.0–8.5)

## 2016-12-20 LAB — LIPID PANEL
CHOLESTEROL TOTAL: 196 mg/dL (ref 100–199)
Chol/HDL Ratio: 4 ratio units (ref 0.0–4.4)
HDL: 49 mg/dL (ref >39)
LDL CALC: 112 mg/dL — AB (ref 0–99)
TRIGLYCERIDES: 174 mg/dL — AB (ref 0–149)
VLDL Cholesterol Cal: 35 mg/dL (ref 5–40)

## 2016-12-20 LAB — TSH: TSH: 2.28 u[IU]/mL (ref 0.450–4.500)

## 2016-12-27 ENCOUNTER — Ambulatory Visit (INDEPENDENT_AMBULATORY_CARE_PROVIDER_SITE_OTHER): Payer: BLUE CROSS/BLUE SHIELD | Admitting: Family Medicine

## 2016-12-27 ENCOUNTER — Encounter: Payer: Self-pay | Admitting: Family Medicine

## 2016-12-27 VITALS — BP 124/62 | HR 82 | Temp 98.0°F | Ht 63.0 in | Wt 149.8 lb

## 2016-12-27 DIAGNOSIS — Z01419 Encounter for gynecological examination (general) (routine) without abnormal findings: Secondary | ICD-10-CM | POA: Diagnosis not present

## 2016-12-27 DIAGNOSIS — D500 Iron deficiency anemia secondary to blood loss (chronic): Secondary | ICD-10-CM | POA: Diagnosis not present

## 2016-12-27 MED ORDER — NORETHIN ACE-ETH ESTRAD-FE 1-20 MG-MCG(24) PO TABS: 1.0000 | ORAL_TABLET | Freq: Every day | ORAL | 3 refills | Status: DC

## 2016-12-27 NOTE — Assessment & Plan Note (Signed)
Improved with OCP H/H within normal limits despite not currently taking iron.

## 2016-12-27 NOTE — Patient Instructions (Signed)
Great to see you.  Happy New year!  Please schedule your mammogram after 01/18/2017

## 2016-12-27 NOTE — Progress Notes (Signed)
51 yo pleasant female here for CPX.   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  Seizure disorder- has not had a seizure in years, followed by neurology.  Lab Results  Component Value Date   PHENYTOIN 17.9 03/27/2012   PHENOBARB <2 (L) 11/18/2014   Mammogram scheduled for 01/19/16 No h/o abnormal mammograms.  Td 11/03/09 Already had flu shot this year.  Last pap smear 01/14/15- done by me.   Wt Readings from Last 3 Encounters:  12/27/16 149 lb 12 oz (67.9 kg)  11/08/16 156 lb (70.8 kg)  12/15/15 154 lb 4 oz (70 kg)   Saw her in August 2016 for menorrhagia. Pelvic US showed multiple uterine fibroids, dominant 5.7 cm. Deferred GYN referral.   Bleeding is improved with OCPs. No longer taking iron.  Lab Results  Component Value Date   WBC 5.7 12/19/2016   HGB 11.4 (L) 08/18/2015   HCT 34.6 12/19/2016   MCV 84 12/19/2016   PLT 303 12/19/2016    Lab Results  Component Value Date   CHOL 196 12/19/2016   HDL 49 12/19/2016   LDLCALC 112 (H) 12/19/2016   LDLDIRECT 107.7 03/27/2012   TRIG 174 (H) 12/19/2016   CHOLHDL 4.0 12/19/2016    Patient Active Problem List  Diagnosis  . Obesity  . BREAST MASSES, BILATERAL  . Convulsions ([place])  . Well woman exam  . Heavy periods  . Anemia due to chronic blood loss  . Uterine fibroid  . Cystitis   Past Medical History:  Diagnosis Date  . Abnormal mammogram, unspecified   . Edema    dependent edema of legs, bilateral  . Encounter for long-term (current) use of other medications   . Mass of breast    bilateral  . Obesity, unspecified   . Palpitations   . Routine general medical examination at a health care facility   . Seizure disorder ([place])   . Specified congenital anomalies of breast   . Unspecified hypertrophic and atrophic condition of skin    Past Surgical History:  Procedure Laterality Date  . ELBOW SURGERY     right, as a child  . HEMORRHOID SURGERY     Social History  Substance Use  Topics  . Smoking status: Never Smoker  . Smokeless tobacco: Never Used  . Alcohol use No   Family History  Problem Relation Age of Onset  . Arthritis Mother     knees  . Diabetes Maternal Grandmother   . Cancer Cousin     maternal cousin, colon cancer   No Known Allergies Current Outpatient Prescriptions on File Prior to Visit  Medication Sig Dispense Refill  . ferrous sulfate 325 (65 FE) MG EC tablet Take 325 mg by mouth daily with breakfast.    . folic acid (FOLVITE) 1 MG tablet Take 1 mg by mouth daily.    . primidone (MYSOLINE) 50 MG tablet Take 50 mg by mouth 2 (two) times daily.      No current facility-administered medications on file prior to visit.    The PMH, PSH, Social History, Family History, Medications, and allergies have been reviewed in [facility], and have been updated if relevant.   Review of Systems  Constitutional: Negative for fatigue, fever and unexpected weight change.  Respiratory: Negative.   Cardiovascular: Negative.   Gastrointestinal: Negative.   Endocrine: Negative.   Genitourinary: Negative for menstrual problem.  Musculoskeletal: Negative.   Skin: Negative.   Allergic/Immunologic: Negative.   Neurological: Negative.  Hematological: Negative.   Psychiatric/Behavioral: Negative.   All other systems reviewed and are negative.      Physical Exam  BP 124/62   Pulse 82   Temp 98 F (36.7 C) (Oral)   Ht 5\' 3"  (1.6 m)   Wt 149 lb 12 oz (67.9 kg)   LMP 12/10/2016   SpO2 98%   BMI 26.53 kg/m  Wt Readings from Last 3 Encounters:  12/27/16 149 lb 12 oz (67.9 kg)  11/08/16 156 lb (70.8 kg)  12/15/15 154 lb 4 oz (70 kg)    General:  Well-developed,well-nourished,in no acute distress; alert,appropriate and cooperative throughout examination Head:  normocephalic and atraumatic.   Eyes:  vision grossly intact, pupils equal, pupils round, and pupils reactive to light.   Ears:  R ear normal and L ear normal.   Nose:  no external deformity.    Mouth:  good dentition.   Neck:  No deformities, masses, or tenderness noted. Breasts:  No mass, nodules, thickening, tenderness, bulging, retraction, inflamation, nipple discharge or skin changes noted.   Lungs:  Normal respiratory effort, chest expands symmetrically. Lungs are clear to auscultation, no crackles or wheezes. Heart:  Normal rate and regular rhythm. S1 and S2 normal without gallop, murmur, click, rub or other extra sounds. Abdomen:  Bowel sounds positive,abdomen soft and non-tender without masses, organomegaly or hernias noted. Msk:  No deformity or scoliosis noted of thoracic or lumbar spine.   Extremities:  No clubbing, cyanosis, edema, or deformity noted with normal full range of motion of all joints.  Neurologic:  alert & oriented X3 and gait normal.   Skin:  Intact without suspicious lesions or rashes, multiple nevi, non appear dysplastic. Cervical Nodes:  No lymphadenopathy noted Axillary Nodes:  No palpable lymphadenopathy Psych:  Cognition and judgment appear intact. Alert and cooperative with normal attention span and concentration. No apparent delusions, illusions, hallucinations

## 2016-12-27 NOTE — Assessment & Plan Note (Signed)
Reviewed preventive care protocols, scheduled due services, and updated immunizations Discussed nutrition, exercise, diet, and healthy lifestyle.  

## 2017-01-18 IMAGING — MG MM SCREEN MAMMOGRAM BILATERAL
4 series · 4 of 4 positions shown · non-contrast
Comparison: Previous exam(s).

CLINICAL DATA: Screening.

EXAM:
DIGITAL SCREENING BILATERAL MAMMOGRAM WITH CAD

[R CC]
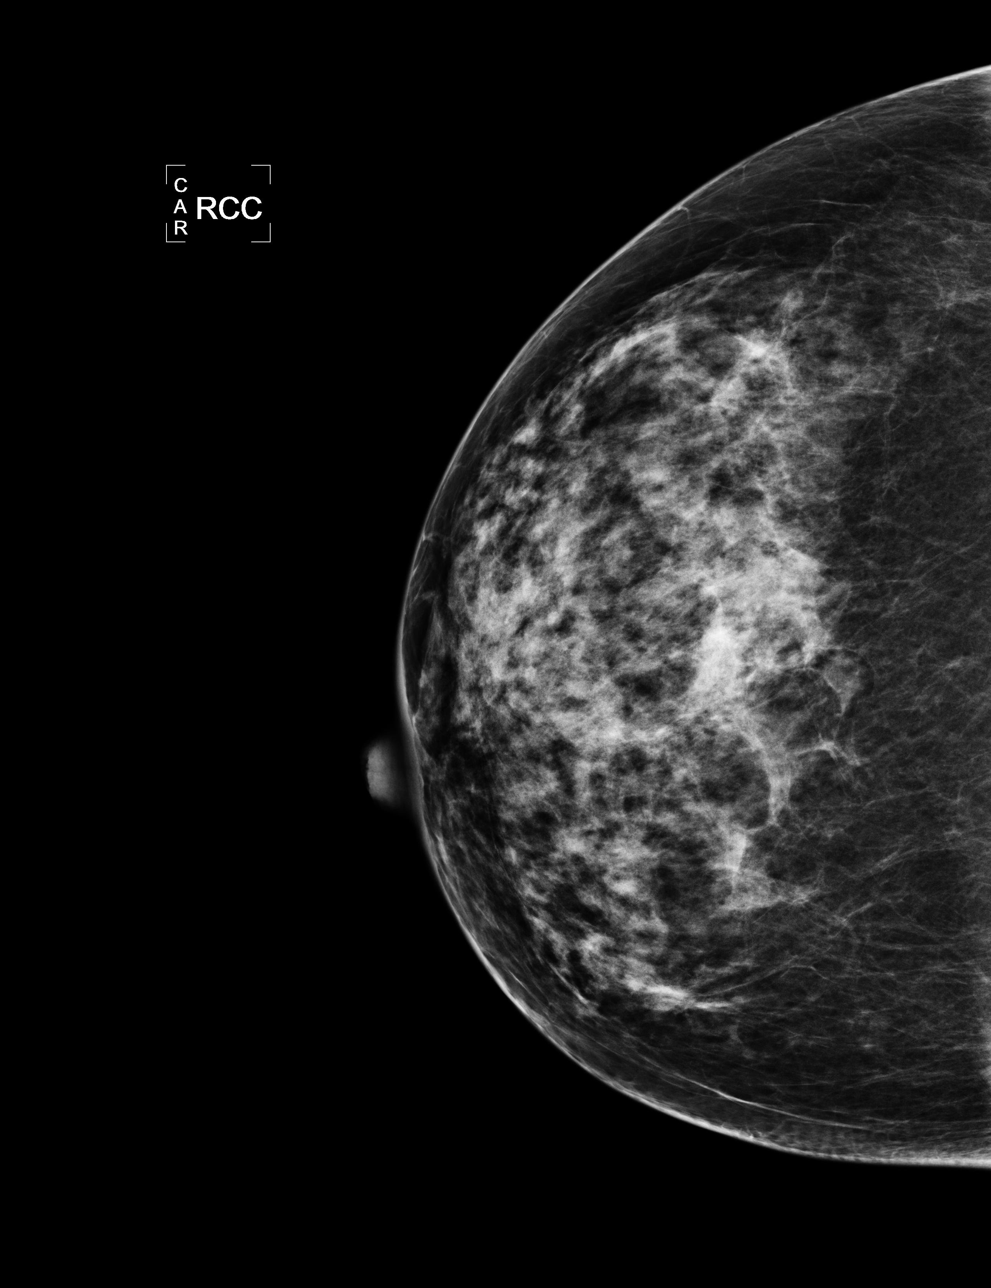

[L CC]
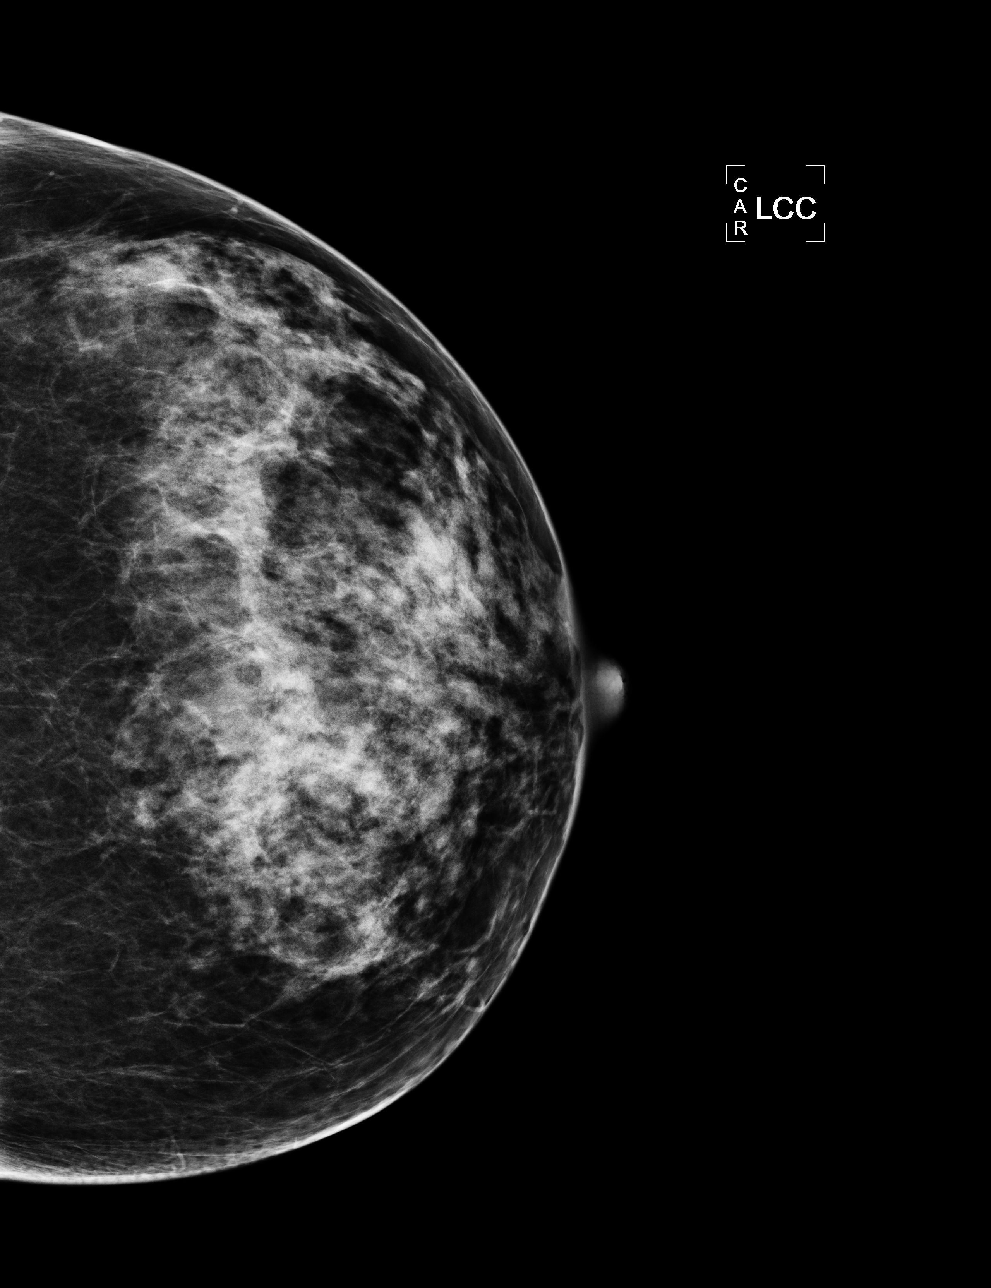

[L MLO]
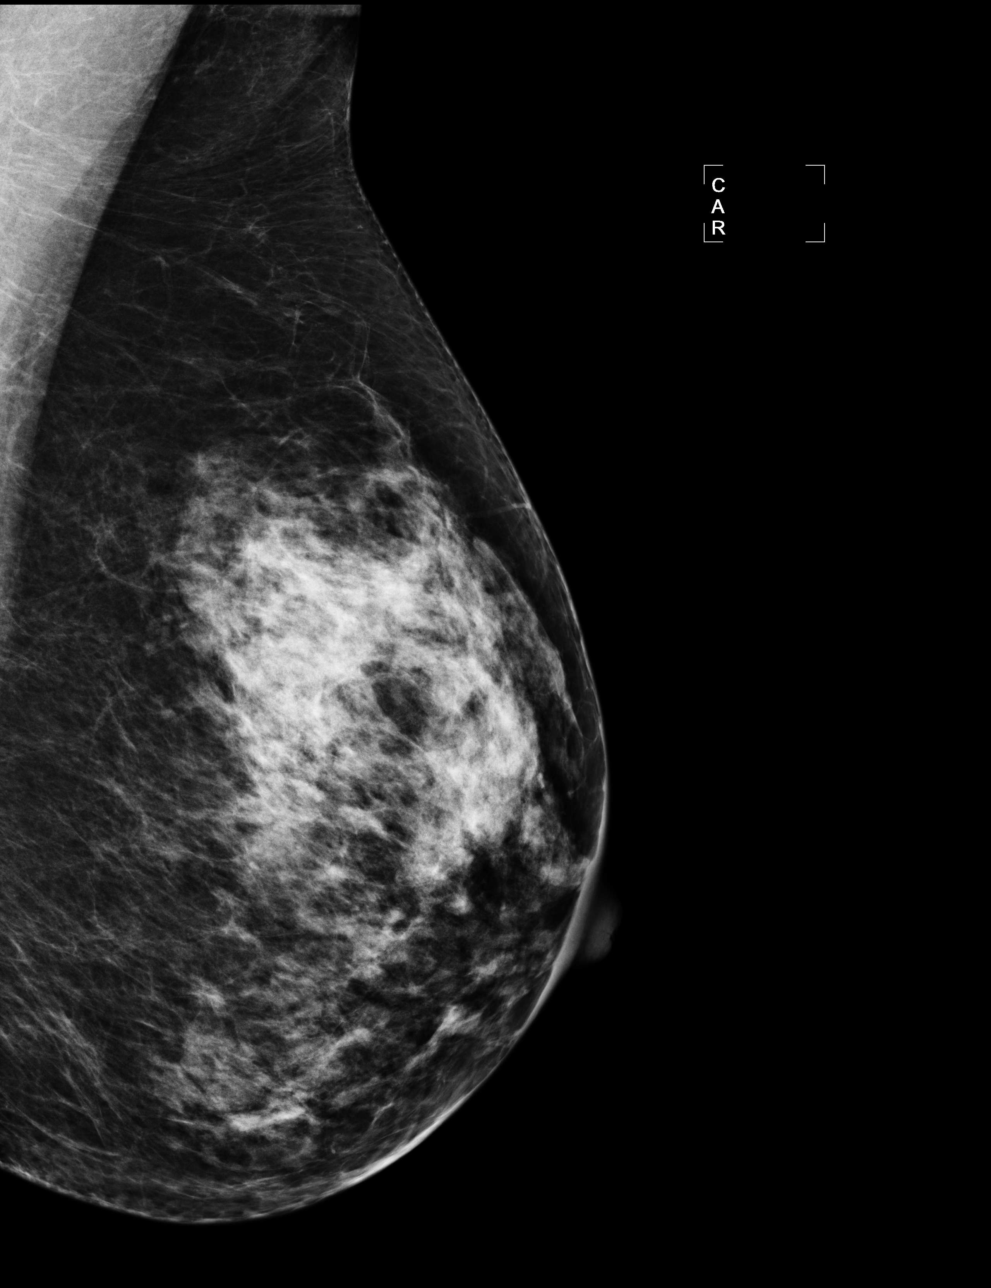

[R MLO]
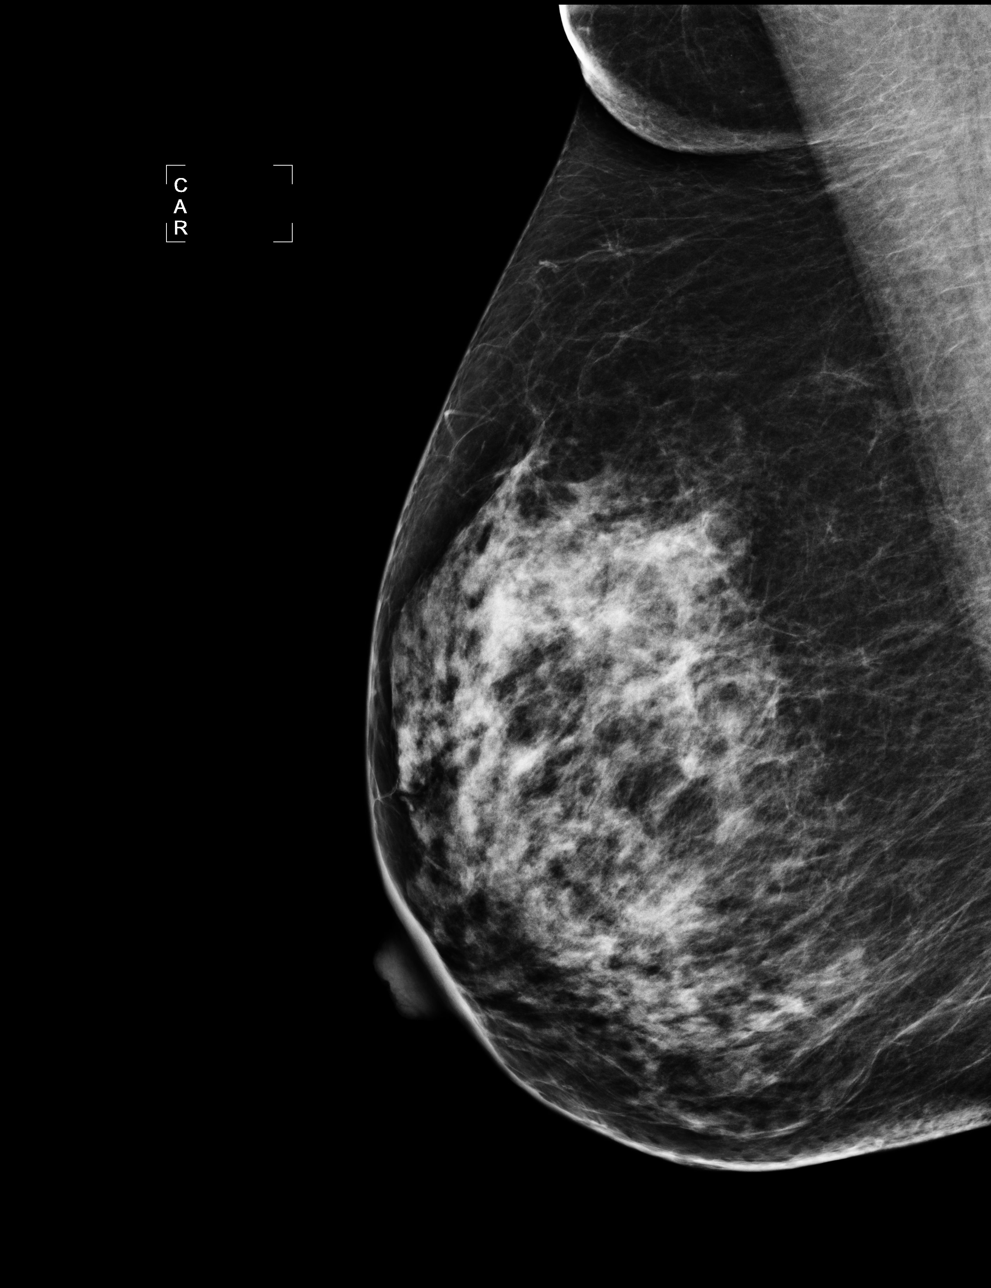

[4 of 4 positions shown; findings below may reference images not displayed]

ACR Breast Density Category c: The breast tissue is heterogeneously
dense, which may obscure small masses.
FINDINGS: There are no findings suspicious for malignancy. Images were
processed with CAD.
IMPRESSION: No mammographic evidence of malignancy. A result letter of this
screening mammogram will be mailed directly to the patient.

RECOMMENDATION:
Screening mammogram in one year. (Code:YJ-2-FEZ)

BI-RADS CATEGORY  1: Negative.

## 2017-02-15 ENCOUNTER — Telehealth: Payer: Self-pay

## 2017-02-15 NOTE — Telephone Encounter (Signed)
Pt left v/m will be traveling to [place] in June and pt request any vaccinations needed.for update on needed immunizations suggested to call health dept travel center or [facility]. Pt said she had already checked and needs malaria,typhoid, hep B (pt already has titer for Hep B) and Hep A (Tasha in lab said could do titer for Hep A). Pt request cb after Dr Deborra Medina reviews.

## 2017-02-15 NOTE — Telephone Encounter (Signed)
Yes I agree that she should receive those.  I think she needs to go to the health department for typhoid correct?  I can give her an rx for malaria prophylaxis.

## 2017-02-16 NOTE — Telephone Encounter (Signed)
Left a message on vm per DPR. I did mention [facility]. They can usually do everything.

## 2017-03-13 ENCOUNTER — Encounter: Payer: Self-pay | Admitting: *Deleted

## 2017-08-22 IMAGING — US US TRANSVAGINAL NON-OB
1 series · 14 of 25 positions shown · non-contrast
Comparison: None

CLINICAL DATA: Menorrhagia

EXAM:
TRANSABDOMINAL AND TRANSVAGINAL ULTRASOUND OF PELVIS
TECHNIQUE: Both transabdominal and transvaginal ultrasound examinations of the
pelvis were performed. Transabdominal technique was performed for
global imaging of the pelvis including uterus, ovaries, adnexal
regions, and pelvic cul-de-sac. It was necessary to proceed with
endovaginal exam following the transabdominal exam to visualize the
endometrium and bilateral adnexa.

[Series 1: us transvaginal non-ob · 0.30mm/px · 14 of 86 slices shown]
[im 1/86]
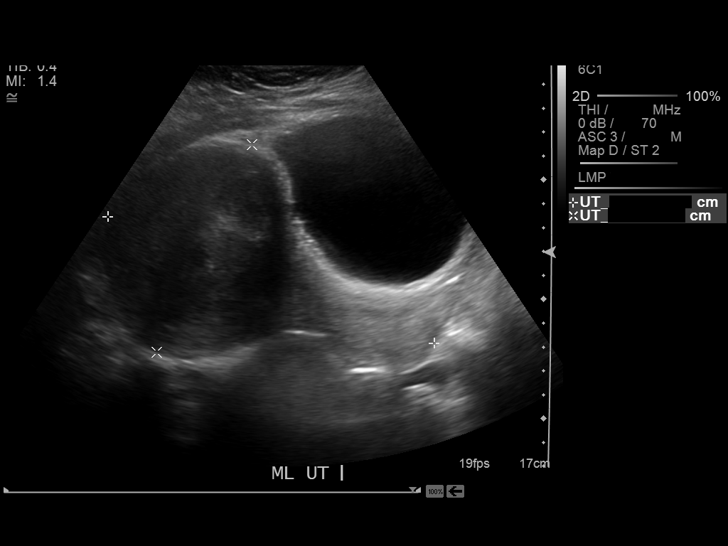
[im 8/86]
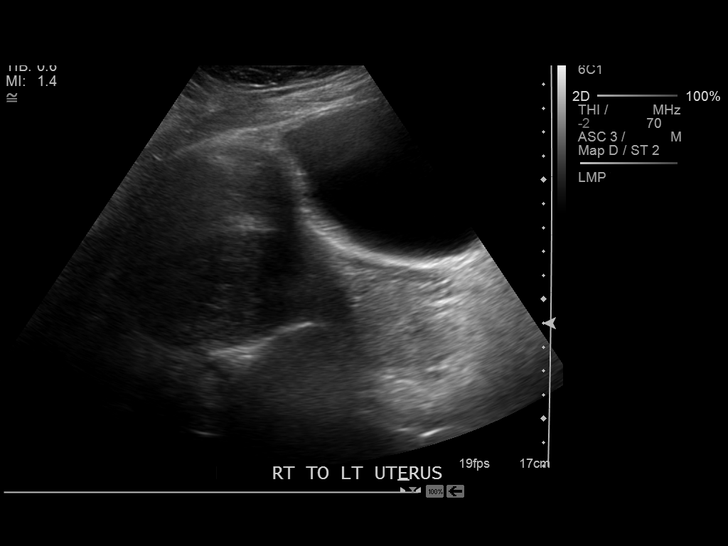
[im 15/86]
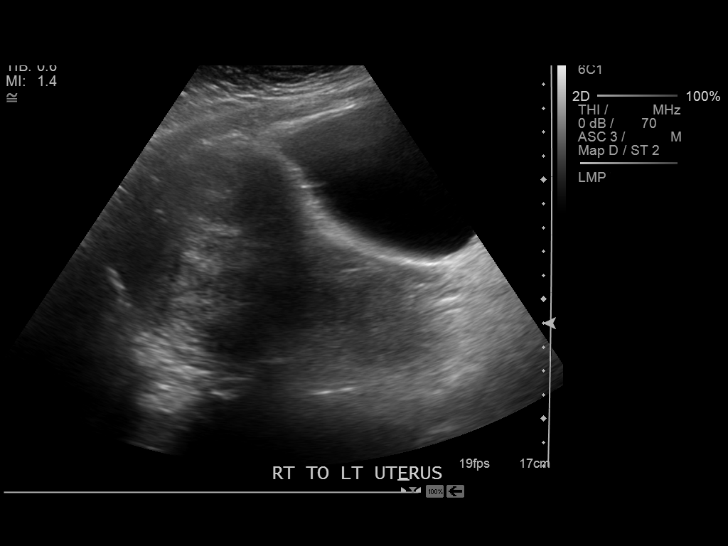
[im 22/86]
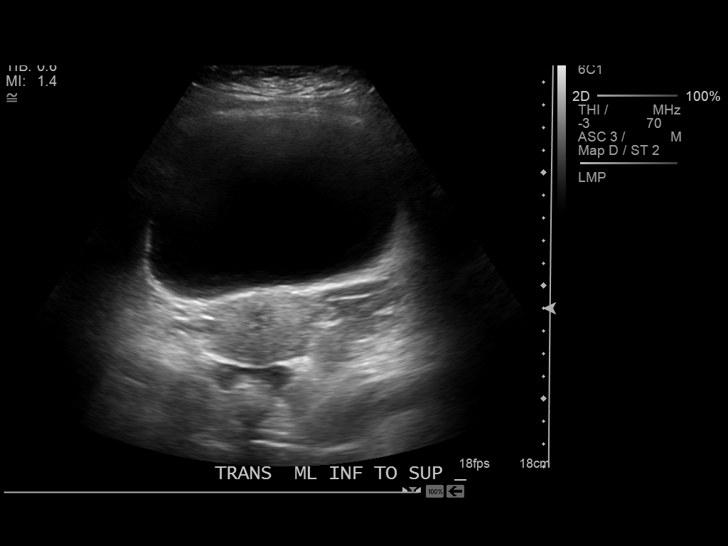
[im 29/86]
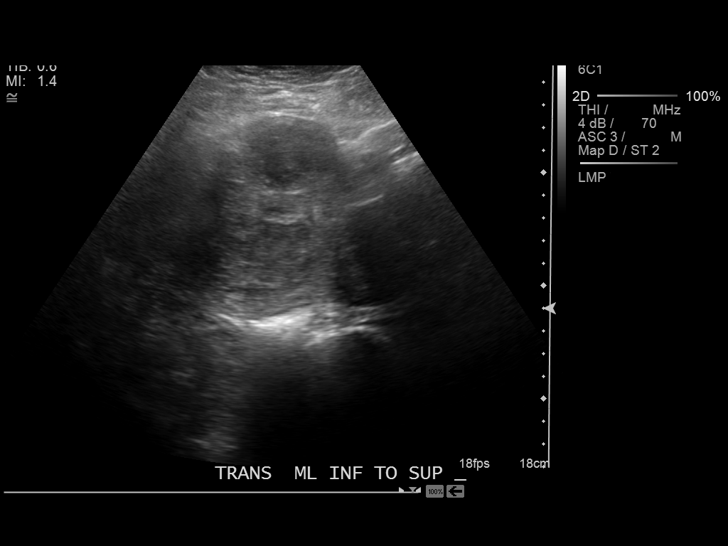
[im 32/86]
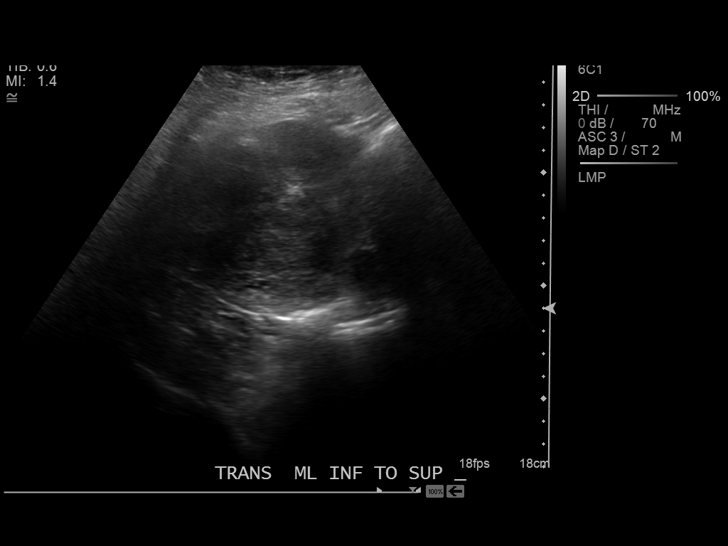
[im 39/86]
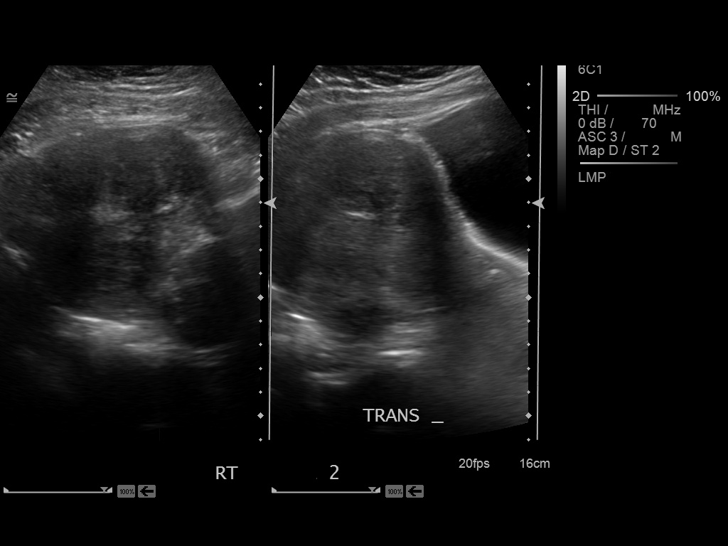
[im 47/86]
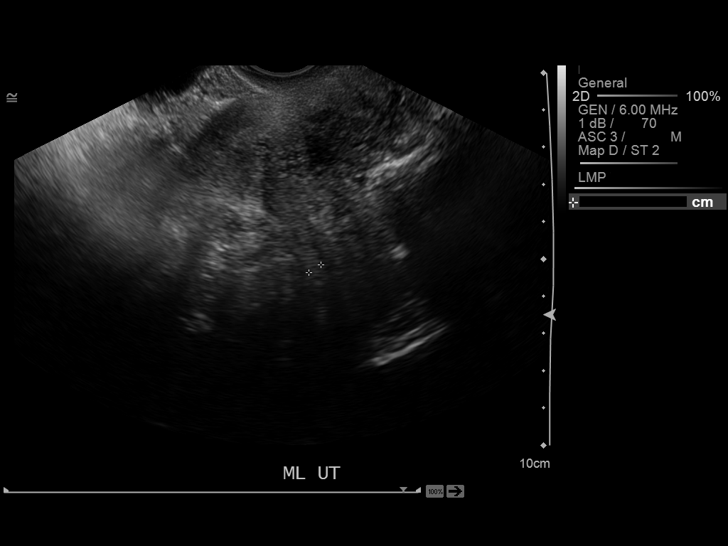
[im 54/86]
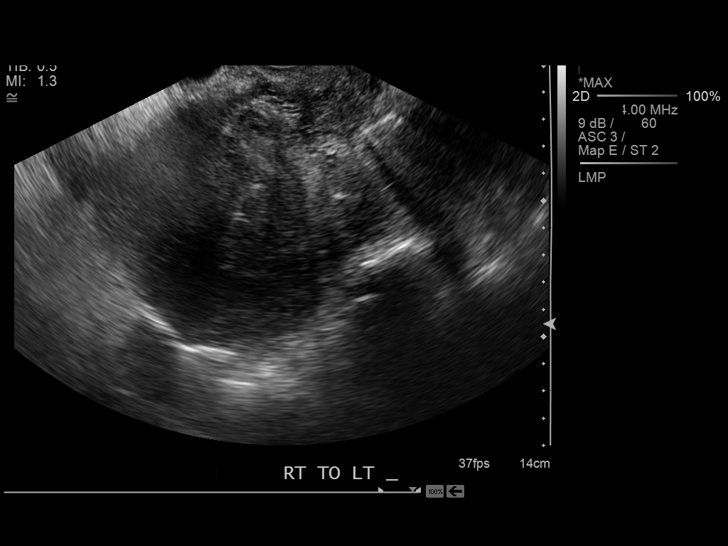
[im 57/86]
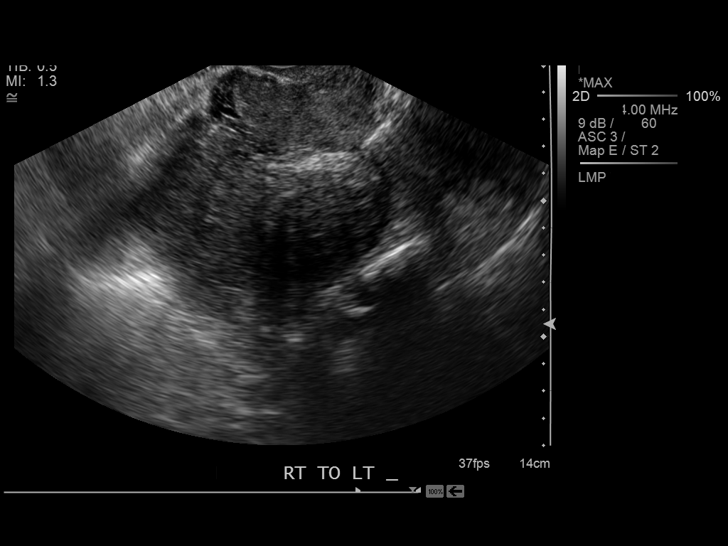
[im 64/86]
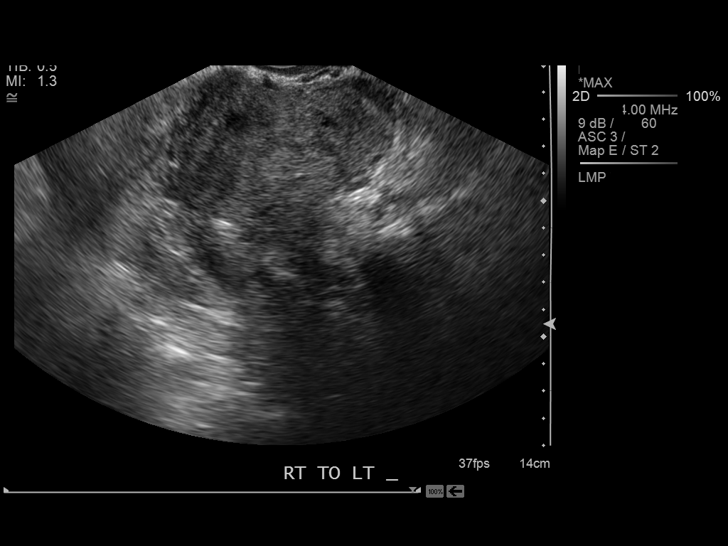
[im 71/86]
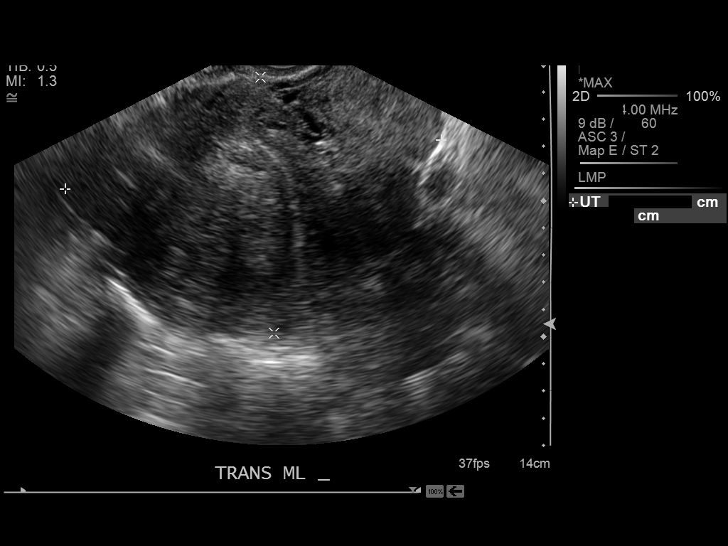
[im 78/86]
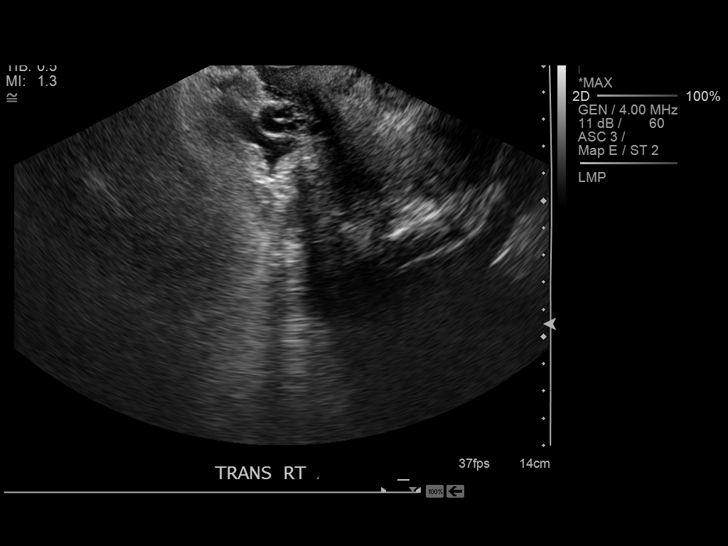
[im 86/86]
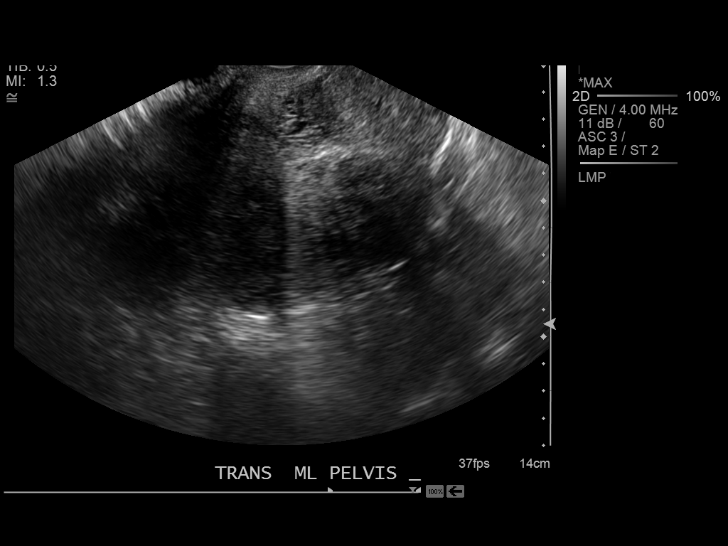

[14 of 25 positions shown; findings below may reference images not displayed]

FINDINGS: Uterus

Measurements: 14.7 x 9.6 x 14.0 cm. Multiple uterine fibroids,
including:

--5.5 x 4.4 x 5.7 cm intramural fibroid in the right fundus

--5.0 x 5.2 x 4.9 cm subserosal fibroid in the left uterine body

--4.1 x 3.4 x 3.8 cm intramural fibroid in the right fundus

Endometrium

Thickness: 4 mm.  Poorly visualized due to multiple fibroids.

Right ovary

Not discretely visualized due to fibroids.

Left ovary

Not discretely visualized due to fibroids.

Other findings

No free fluid.
IMPRESSION: Multiple uterine fibroids, including a dominant 5.7 cm intramural
fibroid in the right uterine fundus.

## 2017-12-11 ENCOUNTER — Other Ambulatory Visit: Payer: Self-pay | Admitting: Family Medicine

## 2018-01-02 ENCOUNTER — Encounter: Payer: Self-pay | Admitting: Family Medicine

## 2018-01-02 ENCOUNTER — Ambulatory Visit (INDEPENDENT_AMBULATORY_CARE_PROVIDER_SITE_OTHER): Payer: BC Managed Care – PPO | Admitting: Family Medicine

## 2018-01-02 ENCOUNTER — Other Ambulatory Visit (HOSPITAL_COMMUNITY)
Admission: RE | Admit: 2018-01-02 | Discharge: 2018-01-02 | Disposition: A | Discharge status: Home / Self Care | Payer: BC Managed Care – PPO | Source: Ambulatory Visit | Attending: Family Medicine | Admitting: Family Medicine

## 2018-01-02 VITALS — BP 114/78 | HR 80 | Temp 98.8°F | Ht 63.5 in | Wt 153.0 lb

## 2018-01-02 DIAGNOSIS — R569 Unspecified convulsions: Secondary | ICD-10-CM | POA: Diagnosis present

## 2018-01-02 DIAGNOSIS — Z01419 Encounter for gynecological examination (general) (routine) without abnormal findings: Secondary | ICD-10-CM | POA: Insufficient documentation

## 2018-01-02 LAB — LIPID PANEL
Cholesterol: 215 mg/dL — ABNORMAL HIGH (ref 0–200)
HDL: 51.1 mg/dL (ref >39.00)
NONHDL: 164.39
Total CHOL/HDL Ratio: 4
Triglycerides: 235 mg/dL — ABNORMAL HIGH (ref 0.0–149.0)
VLDL: 47 mg/dL — ABNORMAL HIGH (ref 0.0–40.0)

## 2018-01-02 LAB — UNLABELED: Test Ordered On Req: 751

## 2018-01-02 LAB — COMPREHENSIVE METABOLIC PANEL
ALK PHOS: 58 U/L (ref 39–117)
ALT: 8 U/L (ref 0–35)
AST: 11 U/L (ref 0–37)
Albumin: 4.3 g/dL (ref 3.5–5.2)
BILIRUBIN TOTAL: 0.4 mg/dL (ref 0.2–1.2)
BUN: 10 mg/dL (ref 6–23)
CALCIUM: 9.3 mg/dL (ref 8.4–10.5)
CO2: 25 mEq/L (ref 19–32)
CREATININE: 0.64 mg/dL (ref 0.40–1.20)
Chloride: 99 mEq/L (ref 96–112)
GFR: 104.57 mL/min (ref >60.00)
GLUCOSE: 86 mg/dL (ref 70–99)
Potassium: 3.8 mEq/L (ref 3.5–5.1)
Sodium: 135 mEq/L (ref 135–145)
Total Protein: 6.9 g/dL (ref 6.0–8.3)

## 2018-01-02 LAB — CBC WITH DIFFERENTIAL/PLATELET
BASOS ABS: 0 10*3/uL (ref 0.0–0.1)
Basophils Relative: 0.6 % (ref 0.0–3.0)
Eosinophils Absolute: 0.1 10*3/uL (ref 0.0–0.7)
Eosinophils Relative: 2.2 % (ref 0.0–5.0)
HCT: 38.4 % (ref 36.0–46.0)
Hemoglobin: 12.8 g/dL (ref 12.0–15.0)
LYMPHS PCT: 31 % (ref 12.0–46.0)
Lymphs Abs: 1.7 10*3/uL (ref 0.7–4.0)
MCHC: 33.3 g/dL (ref 30.0–36.0)
MCV: 89.3 fl (ref 78.0–100.0)
MONOS PCT: 4.6 % (ref 3.0–12.0)
Monocytes Absolute: 0.3 10*3/uL (ref 0.1–1.0)
NEUTROS PCT: 61.6 % (ref 43.0–77.0)
Neutro Abs: 3.4 10*3/uL (ref 1.4–7.7)
Platelets: 382 10*3/uL (ref 150.0–400.0)
RBC: 4.31 Mil/uL (ref 3.87–5.11)
RDW: 11.5 % (ref 11.5–15.5)
WBC: 5.5 10*3/uL (ref 4.0–10.5)

## 2018-01-02 LAB — HEMOGLOBIN A1C: Hgb A1c MFr Bld: 5.1 % (ref 4.6–6.5)

## 2018-01-02 LAB — LDL CHOLESTEROL, DIRECT: LDL DIRECT: 146 mg/dL

## 2018-01-02 LAB — VITAMIN D 25 HYDROXY (VIT D DEFICIENCY, FRACTURES): VITD: 18.34 ng/mL — AB (ref 30.00–100.00)

## 2018-01-02 LAB — TSH: TSH: 1.87 u[IU]/mL (ref 0.35–4.50)

## 2018-01-02 LAB — VITAMIN B12: VITAMIN B 12: 410 pg/mL (ref 211–911)

## 2018-01-02 MED ORDER — NORETHIN ACE-ETH ESTRAD-FE 1-20 MG-MCG(24) PO TABS: 1.0000 | ORAL_TABLET | Freq: Every day | ORAL | 3 refills | Status: DC

## 2018-01-02 NOTE — Assessment & Plan Note (Signed)
Reviewed preventive care protocols, scheduled due services, and updated immunizations Discussed nutrition, exercise, diet, and healthy lifestyle.  Orders Placed This Encounter  Procedures  . CBC with Differential/Platelet  . Comprehensive metabolic panel  . Lipid panel  . TSH  . Hemoglobin A1c  . B12  . Vitamin D (25 hydroxy)  . Primidone level

## 2018-01-02 NOTE — Progress Notes (Signed)
52 yo pleasant female here for CPX.   Health Maintenance  Topic Date Due  . HIV Screening  05/26[birth year]1983  . MAMMOGRAM  01/18/2017  . PAP SMEAR  12/14/2018  . TETANUS/TDAP  11/04/2019  . INFLUENZA VACCINE  Completed     Doing well- son is now 74.  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  Seizure disorder- has not had a seizure in years, followed by neurology.  Seeing Dr. Manuella Ghazi later this month. Lab Results  Component Value Date   PHENYTOIN 17.9 03/27/2012   PHENOBARB <2 (L) 11/18/2014   Mammogram scheduled for 01/19/16 No h/o abnormal mammograms.    Last pap smear 01/14/15- done by me.   Wt Readings from Last 3 Encounters:  01/02/18 153 lb (69.4 kg)  12/27/16 149 lb 12 oz (67.9 kg)  11/08/16 156 lb (70.8 kg)   Saw her in August 2016 for menorrhagia. Pelvic US showed multiple uterine fibroids, dominant 5.7 cm. Deferred GYN referral.   Bleeding is improved with OCPs. No longer taking iron. Periods sometimes irregular.  Lab Results  Component Value Date   WBC 5.7 12/19/2016   HGB 11.4 12/19/2016   HCT 34.6 12/19/2016   MCV 84 12/19/2016   PLT 303 12/19/2016    Lab Results  Component Value Date   CHOL 196 12/19/2016   HDL 49 12/19/2016   LDLCALC 112 (H) 12/19/2016   LDLDIRECT 107.7 03/27/2012   TRIG 174 (H) 12/19/2016   CHOLHDL 4.0 12/19/2016    Patient Active Problem List   Diagnosis Date Noted  . Cystitis 11/08/2016  . Uterine fibroid 12/15/2015  . Anemia due to chronic blood loss 08/18/2015  . Well woman exam 11/18/2014  . BREAST MASSES, BILATERAL 10/16/2007  . Convulsions ([place]) 04/29/2007   Past Medical History:  Diagnosis Date  . Abnormal mammogram, unspecified   . Edema    dependent edema of legs, bilateral  . Encounter for long-term (current) use of other medications   . Mass of breast    bilateral  . Obesity, unspecified   . Palpitations   . Routine general medical examination at a health care facility   . Seizure disorder ([place])   . Specified  congenital anomalies of breast   . Unspecified hypertrophic and atrophic condition of skin    Past Surgical History:  Procedure Laterality Date  . ELBOW SURGERY     right, as a child  . HEMORRHOID SURGERY     Social History   Tobacco Use  . Smoking status: Never Smoker  . Smokeless tobacco: Never Used  Substance Use Topics  . Alcohol use: No    Alcohol/week: 0.0 oz  . Drug use: No   Family History  Problem Relation Age of Onset  . Arthritis Mother        knees  . Diabetes Maternal Grandmother   . Cancer Cousin        maternal cousin, colon cancer   No Known Allergies Current Outpatient Medications on File Prior to Visit  Medication Sig Dispense Refill  . cholecalciferol (VITAMIN D) 1000 units tablet Take 1,000 Units by mouth daily.    . ferrous sulfate 325 (65 FE) MG EC tablet Take 325 mg by mouth daily with breakfast.    . folic acid (FOLVITE) 1 MG tablet Take 1 mg by mouth daily.    . primidone (MYSOLINE) 50 MG tablet Take 50 mg by mouth 2 (two) times daily.     . vitamin B-12 (CYANOCOBALAMIN) 100 MCG tablet Take by mouth.  No current facility-administered medications on file prior to visit.    The PMH, PSH, Social History, Family History, Medications, and allergies have been reviewed in [facility], and have been updated if relevant.   Review of Systems  Constitutional: Negative for fatigue, fever and unexpected weight change.  Respiratory: Negative.   Cardiovascular: Negative.   Gastrointestinal: Negative.   Endocrine: Negative.   Genitourinary: Negative for menstrual problem.  Musculoskeletal: Negative.   Skin: Negative.   Allergic/Immunologic: Negative.   Neurological: Negative.   Hematological: Negative.   Psychiatric/Behavioral: Negative.   All other systems reviewed and are negative.      Physical Exam  BP 114/78 (BP Location: Left Arm, Patient Position: Sitting, Cuff Size: Normal)   Pulse 80   Temp 98.8 F (37.1 C) (Oral)   Ht 5' 3.5" (1.613 m)    Wt 153 lb (69.4 kg)   LMP 12/02/2017   SpO2 98%   BMI 26.68 kg/m  Wt Readings from Last 3 Encounters:  01/02/18 153 lb (69.4 kg)  12/27/16 149 lb 12 oz (67.9 kg)  11/08/16 156 lb (70.8 kg)    General:  Well-developed,well-nourished,in no acute distress; alert,appropriate and cooperative throughout examination Head:  normocephalic and atraumatic.   Eyes:  vision grossly intact, PERRL Ears:  R ear normal and L ear normal externally, TMs clear bilaterally Nose:  no external deformity.   Mouth:  good dentition.   Neck:  No deformities, masses, or tenderness noted. Breasts:  No mass, nodules, thickening, tenderness, bulging, retraction, inflamation, nipple discharge or skin changes noted.   Lungs:  Normal respiratory effort, chest expands symmetrically. Lungs are clear to auscultation, no crackles or wheezes. Heart:  Normal rate and regular rhythm. S1 and S2 normal without gallop, murmur, click, rub or other extra sounds. Abdomen:  Bowel sounds positive,abdomen soft and non-tender without masses, organomegaly or hernias noted. Rectal:  no external abnormalities.   Genitalia:  Pelvic Exam:        External: normal female genitalia without lesions or masses        Vagina: normal without lesions or masses        Cervix: normal without lesions or masses        Adnexa: normal bimanual exam without masses or fullness        Uterus: normal by palpation        Pap smear: performed Msk:  No deformity or scoliosis noted of thoracic or lumbar spine.   Extremities:  No clubbing, cyanosis, edema, or deformity noted with normal full range of motion of all joints.   Neurologic:  alert & oriented X3 and gait normal.   Skin:  Intact without suspicious lesions or rashes Cervical Nodes:  No lymphadenopathy noted Axillary Nodes:  No palpable lymphadenopathy Psych:  Cognition and judgment appear intact. Alert and cooperative with normal attention span and concentration. No apparent delusions, illusions,  hallucinations

## 2018-01-02 NOTE — Patient Instructions (Signed)
Great to see you!  Happy New Year! 

## 2018-01-07 ENCOUNTER — Encounter: Payer: Self-pay | Admitting: *Deleted

## 2018-01-07 LAB — CYTOLOGY - PAP
Diagnosis: NEGATIVE
HPV (WINDOPATH): NOT DETECTED

## 2018-01-07 MED ORDER — VITAMIN D3 1.25 MG (50000 UT) PO TABS: 1.0000 | ORAL_TABLET | ORAL | 0 refills | Status: DC

## 2018-01-07 NOTE — Addendum Note (Signed)
Addended by: Verlene Mayer A on: 01/07/2018 03:31 PM   Modules accepted: Orders

## 2018-01-08 ENCOUNTER — Telehealth: Payer: Self-pay

## 2018-01-08 DIAGNOSIS — E559 Vitamin D deficiency, unspecified: Secondary | ICD-10-CM

## 2018-01-08 NOTE — Telephone Encounter (Signed)
Future order created for Christine Saunders to be drawn in 8-12 wks per TA/thx dmf

## 2018-01-09 ENCOUNTER — Other Ambulatory Visit: Payer: Self-pay | Admitting: Family Medicine

## 2018-01-09 DIAGNOSIS — Z1231 Encounter for screening mammogram for malignant neoplasm of breast: Secondary | ICD-10-CM

## 2018-01-09 LAB — PRIMIDONE, SERUM: PRIMIDONE, SERUM: 4 mg/L — AB (ref 5.0–12.0)

## 2018-01-09 LAB — TIQ-NTM

## 2018-01-09 LAB — PAT ID TIQ DOC: TEST AFFECTED: 751

## 2018-01-09 LAB — SPECIMEN ID NOTIFICATION MISSING 2ND ID

## 2018-01-30 ENCOUNTER — Other Ambulatory Visit: Payer: Self-pay | Admitting: Family Medicine

## 2018-01-30 ENCOUNTER — Ambulatory Visit
Admission: RE | Admit: 2018-01-30 | Discharge: 2018-01-30 | Disposition: A | Discharge status: Home / Self Care | Payer: BC Managed Care – PPO | Source: Ambulatory Visit | Attending: Family Medicine | Admitting: Family Medicine

## 2018-01-30 DIAGNOSIS — Z1231 Encounter for screening mammogram for malignant neoplasm of breast: Secondary | ICD-10-CM

## 2018-02-07 ENCOUNTER — Other Ambulatory Visit: Payer: Self-pay | Admitting: Family Medicine

## 2018-02-11 ENCOUNTER — Telehealth: Payer: Self-pay | Admitting: Family Medicine

## 2018-02-11 NOTE — Telephone Encounter (Signed)
Copied from [place]. Topic: Quick Communication - See Telephone Encounter >> Feb 11, 2018  1:32 PM Bea Graff, NT wrote: CRM for notification. See Telephone encounter for: Pt calling to see if she can have her vitamin D levels checked at [facility] in [place] and when she needs to have that drawn. Pt also needing a refill of her Vitamin D [facility] [address] in [place] once the lab is done if that is what Dr. Deborra Medina would like for her to have done. Please contact pt.  02/11/18.

## 2018-02-12 NOTE — Telephone Encounter (Signed)
TA-are you ok with me creating an order and faxing to [facility] for pt in [place]? Also she is requesting a refill of the Rx Vit-Afsheen Antony but pt is supposed to finish 6 doses and then start OTC 1.6k IU qd? What would you like for me to do? Plz advise/thx dmf

## 2018-02-13 NOTE — Telephone Encounter (Signed)
Yes please put those orders in and let's refill her high dose Vit D one time.

## 2018-02-14 MED ORDER — VITAMIN D3 1.25 MG (50000 UT) PO TABS: 1.0000 | ORAL_TABLET | ORAL | 0 refills | Status: DC

## 2018-02-14 NOTE — Telephone Encounter (Signed)
Sent in Rx per TA/created order for Vit-Adden Strout to fax to [facility] per pt req/LMOVM that Rx sent and to plz call with fax number for the [facility] she wants to go to so I may send order/thx dmf

## 2018-02-18 NOTE — Telephone Encounter (Signed)
Order faxed per pt req to [facility]/LMOVM/thx dmf

## 2018-02-18 NOTE — Telephone Encounter (Signed)
Pt wants the orders faxed to her and not sent to [facility],  Fax [phone]

## 2018-02-27 ENCOUNTER — Other Ambulatory Visit: Payer: Self-pay | Admitting: Family Medicine

## 2018-02-27 LAB — VITAMIN D 25 HYDROXY (VIT D DEFICIENCY, FRACTURES): VIT D 25 HYDROXY: 50.7

## 2018-02-28 ENCOUNTER — Encounter: Payer: Self-pay | Admitting: Family Medicine

## 2018-02-28 LAB — VITAMIN D 25 HYDROXY (VIT D DEFICIENCY, FRACTURES): Vit D, 25-Hydroxy: 50.7 ng/mL (ref 30.0–100.0)

## 2018-02-28 NOTE — Progress Notes (Signed)
Order for [name] received from [facility]/thx dmf

## 2018-03-30 ENCOUNTER — Other Ambulatory Visit: Payer: Self-pay | Admitting: Family Medicine

## 2018-04-01 NOTE — Telephone Encounter (Signed)
Needs OTC [name] now that it has normalized/thx dmf

## 2018-04-04 ENCOUNTER — Other Ambulatory Visit: Payer: Self-pay | Admitting: Family Medicine

## 2018-06-02 ENCOUNTER — Other Ambulatory Visit: Payer: Self-pay | Admitting: Family Medicine

## 2018-08-12 ENCOUNTER — Encounter: Payer: Self-pay | Admitting: Family Medicine

## 2019-01-05 ENCOUNTER — Other Ambulatory Visit: Payer: Self-pay | Admitting: Family Medicine

## 2019-01-08 ENCOUNTER — Encounter: Payer: BC Managed Care – PPO | Admitting: Family Medicine

## 2019-01-14 NOTE — Progress Notes (Signed)
53  yo pleasant female here for CPX.   Health Maintenance  Topic Date Due  . COLONOSCOPY  05/25/2018  . MAMMOGRAM  01/30/2019  . TETANUS/TDAP  11/04/2019  . PAP SMEAR-Modifier  01/02/2021  . INFLUENZA VACCINE  Completed  . HIV Screening  Discontinued  Mammogram 01/30/18.  She has mammogram due at [facility] next month- she will all to schedule. No h/o abnormal mammograms. Last pap smear 01/02/18- done by me. No history of abnormal pap smears.  Doing well- son is now in college.  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  Seizure disorder- has not had a seizure in years, followed by neurology.   she saw neurologist last week. Lab Results  Component Value Date   PHENYTOIN 17.9 03/27/2012   PHENOBARB <2 (L) 11/18/2014     Wt Readings from Last 3 Encounters:  01/15/19 148 lb 12.8 oz (67.5 kg)  01/02/18 153 lb (69.4 kg)  12/27/16 149 lb 12 oz (67.9 kg)   Saw her in August 2016 for menorrhagia. Pelvic US showed multiple uterine fibroids, dominant 5.7 cm. Deferred GYN referral.   Bleeding is improved with OCPs. No longer taking iron. Periods sometimes irregular. She is questioning if she can stop taking it because she is questioning is she is perimenopausal.  Lab Results  Component Value Date   WBC 5.5 01/02/2018   HGB 12.8 01/02/2018   HCT 38.4 01/02/2018   MCV 89.3 01/02/2018   PLT 382.0 01/02/2018    Lab Results  Component Value Date   CHOL 215 (H) 01/02/2018   HDL 51.10 01/02/2018   LDLCALC 112 (H) 12/19/2016   LDLDIRECT 146.0 01/02/2018   TRIG 235.0 (H) 01/02/2018   CHOLHDL 4 01/02/2018    Patient Active Problem List   Diagnosis Date Noted  . Uterine fibroid 12/15/2015  . Anemia due to chronic blood loss 08/18/2015  . Well woman exam without gynecological exam 11/18/2014  . BREAST MASSES, BILATERAL 10/16/2007  . Convulsions ([place]) 04/29/2007   Past Medical History:  Diagnosis Date  . Abnormal mammogram, unspecified   . Edema    dependent edema of legs, bilateral  .  Encounter for long-term (current) use of other medications   . Mass of breast    bilateral  . Obesity, unspecified   . Palpitations   . Routine general medical examination at a health care facility   . Seizure disorder ([place])   . Specified congenital anomalies of breast   . Unspecified hypertrophic and atrophic condition of skin    Past Surgical History:  Procedure Laterality Date  . ELBOW SURGERY     right, as a child  . HEMORRHOID SURGERY     Social History   Tobacco Use  . Smoking status: Never Smoker  . Smokeless tobacco: Never Used  Substance Use Topics  . Alcohol use: No    Alcohol/week: 0.0 standard drinks  . Drug use: No   Family History  Problem Relation Age of Onset  . Arthritis Mother        knees  . Diabetes Maternal Grandmother   . Cancer Cousin        maternal cousin, colon cancer   No Known Allergies Current Outpatient Medications on File Prior to Visit  Medication Sig Dispense Refill  . [facility] D3 400 units CAPS TAKE 4 CAPSULES BY MOUTH EVERY DAY START AFTER COMPLETING 50,000 360 capsule 1  . ferrous sulfate 325 (65 FE) MG EC tablet Take 325 mg by mouth daily with breakfast.    .  folic acid (FOLVITE) 1 MG tablet Take 1 mg by mouth daily.    . primidone (MYSOLINE) 50 MG tablet Take 50 mg by mouth 2 (two) times daily.     . vitamin B-12 (CYANOCOBALAMIN) 100 MCG tablet Take by mouth.     No current facility-administered medications on file prior to visit.    The PMH, PSH, Social History, Family History, Medications, and allergies have been reviewed in [facility], and have been updated if relevant.   Review of Systems  Constitutional: Negative for fatigue, fever and unexpected weight change.  HENT: Positive for postnasal drip.   Respiratory: Negative.   Cardiovascular: Negative.   Gastrointestinal: Negative.   Endocrine: Negative.   Genitourinary: Negative for menstrual problem.  Musculoskeletal: Negative.   Skin: Negative.   Allergic/Immunologic: Negative.    Neurological: Negative.   Hematological: Negative.   Psychiatric/Behavioral: Negative.   All other systems reviewed and are negative.      Physical Exam  BP 106/60 (BP Location: Left Arm, Patient Position: Sitting, Cuff Size: Normal)   Pulse 72   Temp 98.7 F (37.1 C) (Oral)   Ht 5' 3.25" (1.607 m)   Wt 148 lb 12.8 oz (67.5 kg)   LMP 01/01/2019 Comment: for 2 days only  SpO2 99%   BMI 26.15 kg/m  Wt Readings from Last 3 Encounters:  01/15/19 148 lb 12.8 oz (67.5 kg)  01/02/18 153 lb (69.4 kg)  12/27/16 149 lb 12 oz (67.9 kg)    General:  Well-developed,well-nourished,in no acute distress; alert,appropriate and cooperative throughout examination Head:  normocephalic and atraumatic.   Eyes:  vision grossly intact, PERRL Ears:  R ear normal and L ear normal externally, TMs clear bilaterally Nose:  no external deformity.   Mouth:  good dentition.   Neck:  No deformities, masses, or tenderness noted. Breasts:  No mass, nodules, thickening, tenderness, bulging, retraction, inflamation, nipple discharge or skin changes noted.   Lungs:  Normal respiratory effort, chest expands symmetrically. Lungs are clear to auscultation, no crackles or wheezes. Heart:  Normal rate and regular rhythm. S1 and S2 normal without gallop, murmur, click, rub or other extra sounds. Abdomen:  Bowel sounds positive,abdomen soft and non-tender without masses, organomegaly or hernias noted. Rectal:  no external abnormalities.   Genitalia:  Pelvic Exam:        External: normal female genitalia without lesions or masses        Vagina: normal without lesions or masses        Cervix: normal without lesions or masses        Adnexa: normal bimanual exam without masses or fullness        Uterus: normal by palpation        Pap smear: performed Msk:  No deformity or scoliosis noted of thoracic or lumbar spine.   Extremities:  No clubbing, cyanosis, edema, or deformity noted with normal full range of motion of all  joints.   Neurologic:  alert & oriented X3 and gait normal.   Skin:  Intact without suspicious lesions or rashes Cervical Nodes:  No lymphadenopathy noted Axillary Nodes:  No palpable lymphadenopathy Psych:  Cognition and judgment appear intact. Alert and cooperative with normal attention span and concentration. No apparent delusions, illusions, hallucinations

## 2019-01-15 ENCOUNTER — Encounter: Payer: Self-pay | Admitting: Family Medicine

## 2019-01-15 ENCOUNTER — Ambulatory Visit (INDEPENDENT_AMBULATORY_CARE_PROVIDER_SITE_OTHER): Payer: BC Managed Care – PPO | Admitting: Family Medicine

## 2019-01-15 VITALS — BP 106/60 | HR 72 | Temp 98.7°F | Ht 63.25 in | Wt 148.8 lb

## 2019-01-15 DIAGNOSIS — Z0000 Encounter for general adult medical examination without abnormal findings: Secondary | ICD-10-CM

## 2019-01-15 DIAGNOSIS — E781 Pure hyperglyceridemia: Secondary | ICD-10-CM

## 2019-01-15 DIAGNOSIS — Z01419 Encounter for gynecological examination (general) (routine) without abnormal findings: Secondary | ICD-10-CM

## 2019-01-15 DIAGNOSIS — R569 Unspecified convulsions: Secondary | ICD-10-CM | POA: Diagnosis not present

## 2019-01-15 DIAGNOSIS — E559 Vitamin D deficiency, unspecified: Secondary | ICD-10-CM | POA: Diagnosis not present

## 2019-01-15 DIAGNOSIS — D500 Iron deficiency anemia secondary to blood loss (chronic): Secondary | ICD-10-CM | POA: Diagnosis not present

## 2019-01-15 LAB — COMPREHENSIVE METABOLIC PANEL
ALT: 10 U/L (ref 0–35)
AST: 12 U/L (ref 0–37)
Albumin: 4.3 g/dL (ref 3.5–5.2)
Alkaline Phosphatase: 60 U/L (ref 39–117)
BILIRUBIN TOTAL: 0.5 mg/dL (ref 0.2–1.2)
BUN: 14 mg/dL (ref 6–23)
CALCIUM: 9.4 mg/dL (ref 8.4–10.5)
CO2: 31 meq/L (ref 19–32)
CREATININE: 0.67 mg/dL (ref 0.40–1.20)
Chloride: 101 mEq/L (ref 96–112)
GFR: 92.93 mL/min (ref >60.00)
GLUCOSE: 89 mg/dL (ref 70–99)
Potassium: 4.4 mEq/L (ref 3.5–5.1)
Sodium: 136 mEq/L (ref 135–145)
TOTAL PROTEIN: 6.8 g/dL (ref 6.0–8.3)

## 2019-01-15 LAB — LIPID PANEL
CHOL/HDL RATIO: 4
Cholesterol: 203 mg/dL — ABNORMAL HIGH (ref 0–200)
HDL: 56.4 mg/dL (ref >39.00)
LDL Cholesterol: 118 mg/dL — ABNORMAL HIGH (ref 0–99)
NONHDL: 146.52
Triglycerides: 141 mg/dL (ref 0.0–149.0)
VLDL: 28.2 mg/dL (ref 0.0–40.0)

## 2019-01-15 LAB — CBC WITH DIFFERENTIAL/PLATELET
BASOS ABS: 0 10*3/uL (ref 0.0–0.1)
Basophils Relative: 0.5 % (ref 0.0–3.0)
EOS PCT: 4.1 % (ref 0.0–5.0)
Eosinophils Absolute: 0.2 10*3/uL (ref 0.0–0.7)
HEMATOCRIT: 40.4 % (ref 36.0–46.0)
HEMOGLOBIN: 13.6 g/dL (ref 12.0–15.0)
LYMPHS PCT: 30 % (ref 12.0–46.0)
Lymphs Abs: 1.8 10*3/uL (ref 0.7–4.0)
MCHC: 33.6 g/dL (ref 30.0–36.0)
MCV: 88.6 fl (ref 78.0–100.0)
MONOS PCT: 5.2 % (ref 3.0–12.0)
Monocytes Absolute: 0.3 10*3/uL (ref 0.1–1.0)
NEUTROS PCT: 60.2 % (ref 43.0–77.0)
Neutro Abs: 3.6 10*3/uL (ref 1.4–7.7)
Platelets: 343 10*3/uL (ref 150.0–400.0)
RBC: 4.57 Mil/uL (ref 3.87–5.11)
RDW: 11.9 % (ref 11.5–15.5)
WBC: 6 10*3/uL (ref 4.0–10.5)

## 2019-01-15 LAB — FERRITIN: FERRITIN: 39 ng/mL (ref 10.0–291.0)

## 2019-01-15 LAB — VITAMIN D 25 HYDROXY (VIT D DEFICIENCY, FRACTURES): VITD: 38.8 ng/mL (ref 30.00–100.00)

## 2019-01-15 LAB — TSH: TSH: 1.15 u[IU]/mL (ref 0.35–4.50)

## 2019-01-15 NOTE — Assessment & Plan Note (Signed)
Reviewed preventive care protocols, scheduled due services, and updated immunizations Discussed nutrition, exercise, diet, and healthy lifestyle.  Will order cologuard today.   Lab Orders     Comp Met (CMET)     Lipid Profile     CBC w/Diff     TSH     Vitamin B12     VITAMIN D 25 Hydroxy (Vit-D Deficiency, Fractures)     Ferritin

## 2019-01-15 NOTE — Assessment & Plan Note (Addendum)
Improved with OCPs. Due for labs today. She would like to stop OCPs and repeat iron in a few months.

## 2019-01-15 NOTE — Assessment & Plan Note (Signed)
Followed by neuro 

## 2019-01-15 NOTE — Patient Instructions (Addendum)
Great to see you. I will call you with your lab results from today and you can view them online.    Please stop taking [place] and update me in a few months, sooner if you have any concerns.

## 2019-01-16 LAB — VITAMIN B12: Vitamin B-12: 454 pg/mL (ref 211–911)

## 2019-01-21 ENCOUNTER — Encounter: Payer: Self-pay | Admitting: Family Medicine

## 2019-01-22 ENCOUNTER — Other Ambulatory Visit: Payer: Self-pay

## 2019-01-22 DIAGNOSIS — Z0000 Encounter for general adult medical examination without abnormal findings: Secondary | ICD-10-CM

## 2019-01-27 LAB — COLOGUARD: Cologuard: NEGATIVE

## 2019-01-29 ENCOUNTER — Other Ambulatory Visit: Payer: BC Managed Care – PPO

## 2019-01-29 ENCOUNTER — Other Ambulatory Visit (INDEPENDENT_AMBULATORY_CARE_PROVIDER_SITE_OTHER): Payer: BC Managed Care – PPO

## 2019-01-29 DIAGNOSIS — Z0000 Encounter for general adult medical examination without abnormal findings: Secondary | ICD-10-CM

## 2019-01-29 LAB — HEMOGLOBIN A1C: HEMOGLOBIN A1C: 5.1 % (ref 4.6–6.5)

## 2019-01-31 ENCOUNTER — Encounter: Payer: Self-pay | Admitting: Family Medicine

## 2020-01-21 ENCOUNTER — Telehealth: Payer: BC Managed Care – PPO | Admitting: Family Medicine

## 2020-01-26 NOTE — Progress Notes (Signed)
Virtual Visit via Video   Due to the COVID-19 pandemic, this visit was completed with telemedicine (audio/video) technology to reduce patient and provider exposure as well as to preserve personal protective equipment.   I connected with Christine Saunders by a video enabled telemedicine application and verified that I am speaking with the correct person using two identifiers. Location patient: Home Location provider: [facility], Office Persons participating in the virtual visit: Jizel Pettinger, Arnette Norris, MD   I discussed the limitations of evaluation and management by telemedicine and the availability of in person appointments. The patient expressed understanding and agreed to proceed.  Care Team   Patient Care Team: Lucille Passy, MD as PCP - General  Subjective:   HPI: Patient is connecting today for a CPE. Patient will call [facility] to schedule a Mammogram as she will be due by the 6th. Cologuard is UTD last completed on 2.3.2020. She is due for her Tdap and Flu vaccines.  Health Maintenance  Topic Date Due  . MAMMOGRAM  01/30/2019  . INFLUENZA VACCINE  07/26/2019  . TETANUS/TDAP  11/04/2019  . PAP SMEAR-Modifier  01/02/2021  . Fecal DNA (Cologuard)  01/27/2022  . HIV Screening  Discontinued    Review of Systems  Constitutional: Negative for fever and malaise/fatigue.  HENT: Negative for congestion and hearing loss.   Eyes: Negative for blurred vision, discharge and redness.  Respiratory: Negative for cough and shortness of breath.   Cardiovascular: Negative for chest pain, palpitations and leg swelling.  Gastrointestinal: Negative for abdominal pain and heartburn.  Genitourinary: Negative for dysuria.  Musculoskeletal: Negative for falls.  Skin: Negative for rash.  Neurological: Negative for loss of consciousness and headaches.  Endo/Heme/Allergies: Does not bruise/bleed easily.  Psychiatric/Behavioral: Negative for depression.  All other systems reviewed and are  negative.    Patient Active Problem List   Diagnosis Date Noted  . Uterine fibroid 12/15/2015  . Anemia due to chronic blood loss 08/18/2015  . Well woman exam without gynecological exam 11/18/2014  . BREAST MASSES, BILATERAL 10/16/2007  . Convulsions ([place]) 04/29/2007    Social History   Tobacco Use  . Smoking status: Never Smoker  . Smokeless tobacco: Never Used  Substance Use Topics  . Alcohol use: No    Alcohol/week: 0.0 standard drinks    Current Outpatient Medications:  .  [employer] D3 400 units CAPS, TAKE 4 CAPSULES BY MOUTH EVERY DAY START AFTER COMPLETING 50,000, Disp: 360 capsule, Rfl: 1 .  ferrous sulfate 325 (65 FE) MG EC tablet, Take 325 mg by mouth daily with breakfast., Disp: , Rfl:  .  folic acid (FOLVITE) 1 MG tablet, Take 1 mg by mouth daily., Disp: , Rfl:  .  primidone (MYSOLINE) 50 MG tablet, Take 50 mg by mouth 2 (two) times daily. , Disp: , Rfl:  .  vitamin B-12 (CYANOCOBALAMIN) 100 MCG tablet, Take by mouth., Disp: , Rfl:   No Known Allergies  Objective:   VITALS: Per patient if applicable, see vitals. GENERAL: Alert, appears well and in no acute distress. HEENT: Atraumatic, conjunctiva clear, no obvious abnormalities on inspection of external nose and ears. NECK: Normal movements of the head and neck. CARDIOPULMONARY: No increased WOB. Speaking in clear sentences. I:E ratio WNL.  MS: Moves all visible extremities without noticeable abnormality. PSYCH: Pleasant and cooperative, well-groomed. Speech normal rate and rhythm. Affect is appropriate. Insight and judgement are appropriate. Attention is focused, linear, and appropriate.  NEURO: CN grossly intact. Oriented as arrived to appointment  on time with no prompting. Moves both UE equally.  SKIN: No obvious lesions, wounds, erythema, or cyanosis noted on face or hands.  Depression screen [facility] 2/9 01/15/2019 01/02/2018 12/15/2015  Decreased Interest 0 0 0  Down, Depressed, Hopeless 0 0 0  PHQ - 2 Score 0 0 0      . COVID-19 Education: The signs and symptoms of COVID-19 were discussed with the patient and how to seek care for testing if needed. The importance of social distancing was discussed today. . Reviewed expectations re: course of current medical issues. . Discussed self-management of symptoms. . Outlined signs and symptoms indicating need for more acute intervention. . Patient verbalized understanding and all questions were answered. Marland Kitchen Health Maintenance issues including appropriate healthy diet, exercise, and smoking avoidance were discussed with patient. . See orders for this visit as documented in the electronic medical record.  Arnette Norris, MD  Records requested if needed. Time spent: 30 minutes, of which >50% was spent in obtaining information about her symptoms, reviewing her previous labs, evaluations, and treatments, counseling her about her condition (please see the discussed topics above), and developing a plan to further investigate it; she had a number of questions which I addressed.   Lab Results  Component Value Date   WBC 6.0 01/15/2019   HGB 13.6 01/15/2019   HCT 40.4 01/15/2019   PLT 343.0 01/15/2019   GLUCOSE 89 01/15/2019   CHOL 203 (H) 01/15/2019   TRIG 141.0 01/15/2019   HDL 56.40 01/15/2019   LDLDIRECT 146.0 01/02/2018   LDLCALC 118 (H) 01/15/2019   ALT 10 01/15/2019   AST 12 01/15/2019   NA 136 01/15/2019   K 4.4 01/15/2019   CL 101 01/15/2019   CREATININE 0.67 01/15/2019   BUN 14 01/15/2019   CO2 31 01/15/2019   TSH 1.15 01/15/2019   HGBA1C 5.1 01/29/2019    Lab Results  Component Value Date   TSH 1.15 01/15/2019   Lab Results  Component Value Date   WBC 6.0 01/15/2019   HGB 13.6 01/15/2019   HCT 40.4 01/15/2019   MCV 88.6 01/15/2019   PLT 343.0 01/15/2019   Lab Results  Component Value Date   NA 136 01/15/2019   K 4.4 01/15/2019   CO2 31 01/15/2019   GLUCOSE 89 01/15/2019   BUN 14 01/15/2019   CREATININE 0.67 01/15/2019   BILITOT  0.5 01/15/2019   ALKPHOS 60 01/15/2019   AST 12 01/15/2019   ALT 10 01/15/2019   PROT 6.8 01/15/2019   ALBUMIN 4.3 01/15/2019   CALCIUM 9.4 01/15/2019   GFR 92.93 01/15/2019   Lab Results  Component Value Date   CHOL 203 (H) 01/15/2019   Lab Results  Component Value Date   HDL 56.40 01/15/2019   Lab Results  Component Value Date   LDLCALC 118 (H) 01/15/2019   Lab Results  Component Value Date   TRIG 141.0 01/15/2019   Lab Results  Component Value Date   CHOLHDL 4 01/15/2019   Lab Results  Component Value Date   HGBA1C 5.1 01/29/2019       Assessment & Plan:   Problem List Items Addressed This Visit    None      I am having Christine Saunders maintain her primidone, folic acid, ferrous sulfate, vitamin B-12, and CVS D3.  No orders of the defined types were placed in this encounter.    Arnette Norris, MD

## 2020-01-28 ENCOUNTER — Other Ambulatory Visit: Payer: Self-pay

## 2020-01-28 ENCOUNTER — Telehealth (INDEPENDENT_AMBULATORY_CARE_PROVIDER_SITE_OTHER): Payer: BC Managed Care – PPO | Admitting: Family Medicine

## 2020-01-28 ENCOUNTER — Other Ambulatory Visit (INDEPENDENT_AMBULATORY_CARE_PROVIDER_SITE_OTHER): Payer: BC Managed Care – PPO

## 2020-01-28 DIAGNOSIS — Z0000 Encounter for general adult medical examination without abnormal findings: Secondary | ICD-10-CM

## 2020-01-28 DIAGNOSIS — Z79899 Other long term (current) drug therapy: Secondary | ICD-10-CM

## 2020-01-28 DIAGNOSIS — E781 Pure hyperglyceridemia: Secondary | ICD-10-CM

## 2020-01-28 DIAGNOSIS — E559 Vitamin D deficiency, unspecified: Secondary | ICD-10-CM

## 2020-01-28 DIAGNOSIS — R569 Unspecified convulsions: Secondary | ICD-10-CM

## 2020-01-28 DIAGNOSIS — Z1231 Encounter for screening mammogram for malignant neoplasm of breast: Secondary | ICD-10-CM

## 2020-01-28 DIAGNOSIS — D500 Iron deficiency anemia secondary to blood loss (chronic): Secondary | ICD-10-CM

## 2020-01-28 LAB — COMPREHENSIVE METABOLIC PANEL
ALT: 13 U/L (ref 0–35)
AST: 14 U/L (ref 0–37)
Albumin: 4.5 g/dL (ref 3.5–5.2)
Alkaline Phosphatase: 68 U/L (ref 39–117)
BUN: 13 mg/dL (ref 6–23)
CO2: 31 mEq/L (ref 19–32)
Calcium: 9.7 mg/dL (ref 8.4–10.5)
Chloride: 101 mEq/L (ref 96–112)
Creatinine, Ser: 0.62 mg/dL (ref 0.40–1.20)
GFR: 101.21 mL/min (ref >60.00)
Glucose, Bld: 92 mg/dL (ref 70–99)
Potassium: 4.4 mEq/L (ref 3.5–5.1)
Sodium: 137 mEq/L (ref 135–145)
Total Bilirubin: 0.5 mg/dL (ref 0.2–1.2)
Total Protein: 7.1 g/dL (ref 6.0–8.3)

## 2020-01-28 LAB — CBC WITH DIFFERENTIAL/PLATELET
Basophils Absolute: 0 10*3/uL (ref 0.0–0.1)
Basophils Relative: 0.5 % (ref 0.0–3.0)
Eosinophils Absolute: 0.2 10*3/uL (ref 0.0–0.7)
Eosinophils Relative: 3.3 % (ref 0.0–5.0)
HCT: 39 % (ref 36.0–46.0)
Hemoglobin: 13.5 g/dL (ref 12.0–15.0)
Lymphocytes Relative: 32.4 % (ref 12.0–46.0)
Lymphs Abs: 1.7 10*3/uL (ref 0.7–4.0)
MCHC: 34.5 g/dL (ref 30.0–36.0)
MCV: 88.2 fl (ref 78.0–100.0)
Monocytes Absolute: 0.3 10*3/uL (ref 0.1–1.0)
Monocytes Relative: 5.1 % (ref 3.0–12.0)
Neutro Abs: 3.1 10*3/uL (ref 1.4–7.7)
Neutrophils Relative %: 58.7 % (ref 43.0–77.0)
Platelets: 292 10*3/uL (ref 150.0–400.0)
RBC: 4.42 Mil/uL (ref 3.87–5.11)
RDW: 11.7 % (ref 11.5–15.5)
WBC: 5.3 10*3/uL (ref 4.0–10.5)

## 2020-01-28 LAB — LIPID PANEL
Cholesterol: 286 mg/dL — ABNORMAL HIGH (ref 0–200)
HDL: 63.4 mg/dL (ref >39.00)
LDL Cholesterol: 191 mg/dL — ABNORMAL HIGH (ref 0–99)
NonHDL: 222.9
Total CHOL/HDL Ratio: 5
Triglycerides: 159 mg/dL — ABNORMAL HIGH (ref 0.0–149.0)
VLDL: 31.8 mg/dL (ref 0.0–40.0)

## 2020-01-28 LAB — TSH: TSH: 1.02 u[IU]/mL (ref 0.35–4.50)

## 2020-01-28 LAB — VITAMIN D 25 HYDROXY (VIT D DEFICIENCY, FRACTURES): VITD: 31.45 ng/mL (ref 30.00–100.00)

## 2020-01-28 LAB — HEMOGLOBIN A1C: Hgb A1c MFr Bld: 5.3 % (ref 4.6–6.5)

## 2020-01-29 LAB — IRON,TIBC AND FERRITIN PANEL
%SAT: 38 % (calc) (ref 16–45)
Ferritin: 75 ng/mL (ref 16–232)
Iron: 150 ug/dL (ref 45–160)
TIBC: 397 mcg/dL (calc) (ref 250–450)

## 2020-01-30 ENCOUNTER — Telehealth: Payer: Self-pay

## 2020-01-30 LAB — VITAMIN B12: Vitamin B-12: 1423 pg/mL — ABNORMAL HIGH (ref 232–1245)

## 2020-01-30 LAB — PRIMIDONE, SERUM
Phenobarbital, Serum: NOT DETECTED ug/mL (ref 15–40)
Primidone Lvl: 3.1 ug/mL — ABNORMAL LOW (ref 5.0–12.0)

## 2020-01-30 NOTE — Telephone Encounter (Signed)
TA-Pt has had a very bad diet as of late/she states that she is going to change this and when she comes to see Dr. Loletha Grayer will have it rechecked at that time/does not want to start any medication at this time/thx dmf

## 2020-03-03 ENCOUNTER — Other Ambulatory Visit: Payer: Self-pay

## 2020-03-03 ENCOUNTER — Encounter: Payer: Self-pay | Admitting: Family Medicine

## 2020-03-03 ENCOUNTER — Ambulatory Visit: Payer: BC Managed Care – PPO | Admitting: Family Medicine

## 2020-03-03 VITALS — BP 112/64 | HR 67 | Temp 97.9°F | Resp 10 | Ht 63.0 in | Wt 150.8 lb

## 2020-03-03 DIAGNOSIS — R922 Inconclusive mammogram: Secondary | ICD-10-CM | POA: Insufficient documentation

## 2020-03-03 DIAGNOSIS — R569 Unspecified convulsions: Secondary | ICD-10-CM

## 2020-03-03 DIAGNOSIS — E538 Deficiency of other specified B group vitamins: Secondary | ICD-10-CM | POA: Diagnosis not present

## 2020-03-03 DIAGNOSIS — E782 Mixed hyperlipidemia: Secondary | ICD-10-CM | POA: Insufficient documentation

## 2020-03-03 DIAGNOSIS — R9230 Dense breasts, unspecified: Secondary | ICD-10-CM | POA: Insufficient documentation

## 2020-03-03 NOTE — Assessment & Plan Note (Signed)
Discussed that LDL >190 and could benefit from statin but otherwise lower risk patient. Continue life style changes and will repeat blood work in 3 months

## 2020-03-03 NOTE — Assessment & Plan Note (Signed)
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 Seeing [facility]. 20+ years since last years. Stable on current medication

## 2020-03-03 NOTE — Patient Instructions (Signed)
Return for blood work in 2 months for cholesterol check

## 2020-03-03 NOTE — Assessment & Plan Note (Signed)
Has had several repeat mammograms, no masses identified but dense tissue. Knows she is due and will call to schedule

## 2020-03-03 NOTE — Assessment & Plan Note (Signed)
Over-replaced on most recent labs. Could decrease daily supplement or not supplement for period of time and reassess if getting symptoms. Pt will consider lower dose daily supplement.

## 2020-03-03 NOTE — Progress Notes (Signed)
Subjective:     Christine Saunders is a 54 y.o. female presenting for Transfer of Care (from Dr Deborra Medina), Hyperlipidemia (trying to control it with exercise and diet and would like to re check this in 3 months), and discuss b12 level (taking B12 supplement every other day now)     HPI   #HLD - had been eating cream cheese and coconut in large quantities - walking 5-10k steps per day  #B12 deficiency - diagnosed in 2012 - has primarily done oral replacement - only taking every other day now - was taking iron due to bad periods - but no longer having periods  Review of Systems   Social History   Tobacco Use  Smoking Status Never Smoker  Smokeless Tobacco Never Used        Objective:    BP Readings from Last 3 Encounters:  03/03/20 112/64  01/15/19 106/60  01/02/18 114/78   Wt Readings from Last 3 Encounters:  03/03/20 150 lb 12 oz (68.4 kg)  01/15/19 148 lb 12.8 oz (67.5 kg)  01/02/18 153 lb (69.4 kg)    BP 112/64   Pulse 67   Temp 97.9 F (36.6 C)   Resp 10   Ht 5\' 3"  (1.6 m)   Wt 150 lb 12 oz (68.4 kg)   LMP 01/03/2019   SpO2 100%   BMI 26.70 kg/m    Physical Exam Constitutional:      General: She is not in acute distress.    Appearance: She is well-developed. She is not diaphoretic.  HENT:     Right Ear: External ear normal.     Left Ear: External ear normal.     Nose: Nose normal.  Eyes:     Conjunctiva/sclera: Conjunctivae normal.  Cardiovascular:     Rate and Rhythm: Normal rate and regular rhythm.     Heart sounds: No murmur.  Pulmonary:     Effort: Pulmonary effort is normal. No respiratory distress.     Breath sounds: Normal breath sounds. No wheezing.  Musculoskeletal:     Cervical back: Neck supple.  Skin:    General: Skin is warm and dry.     Capillary Refill: Capillary refill takes less than 2 seconds.  Neurological:     Mental Status: She is alert. Mental status is at baseline.  Psychiatric:        Mood and Affect: Mood normal.         Behavior: Behavior normal.      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 Seeing [facility]. 20+ years since last years. Stable on current medication      Dense breast tissue on mammogram    Has had several repeat mammograms, no masses identified but dense tissue. Knows she is due and will call to schedule  Vitamin B12 deficiency    Over-replaced on most recent labs. Could decrease daily supplement or not supplement for period of time and reassess if getting symptoms. Pt will consider lower dose daily supplement.       Moderate mixed hyperlipidemia not requiring statin therapy - Primary    Discussed that LDL >190 and could benefit from statin but otherwise lower risk patient. Continue life style changes and will repeat blood work in 3 months      Relevant Orders   Lipid panel       Return in about 1 year (around 03/03/2021) for annual physical.  Lesleigh Noe, MD

## 2020-03-10 ENCOUNTER — Encounter: Payer: BC Managed Care – PPO | Admitting: Family Medicine

## 2020-03-17 ENCOUNTER — Other Ambulatory Visit: Payer: Self-pay

## 2020-03-17 ENCOUNTER — Ambulatory Visit
Admission: RE | Admit: 2020-03-17 | Discharge: 2020-03-17 | Disposition: A | Discharge status: Home / Self Care | Payer: BC Managed Care – PPO | Source: Ambulatory Visit | Attending: Family Medicine | Admitting: Family Medicine

## 2020-03-17 ENCOUNTER — Other Ambulatory Visit: Payer: Self-pay | Admitting: Family Medicine

## 2020-03-17 DIAGNOSIS — Z1231 Encounter for screening mammogram for malignant neoplasm of breast: Secondary | ICD-10-CM

## 2020-05-05 ENCOUNTER — Other Ambulatory Visit: Payer: BC Managed Care – PPO

## 2020-05-05 ENCOUNTER — Ambulatory Visit (INDEPENDENT_AMBULATORY_CARE_PROVIDER_SITE_OTHER): Payer: BC Managed Care – PPO

## 2020-05-05 ENCOUNTER — Other Ambulatory Visit (INDEPENDENT_AMBULATORY_CARE_PROVIDER_SITE_OTHER): Payer: BC Managed Care – PPO

## 2020-05-05 DIAGNOSIS — E782 Mixed hyperlipidemia: Secondary | ICD-10-CM

## 2020-05-05 DIAGNOSIS — Z23 Encounter for immunization: Secondary | ICD-10-CM | POA: Diagnosis not present

## 2020-05-05 LAB — LIPID PANEL
Cholesterol: 225 mg/dL — ABNORMAL HIGH (ref 0–200)
HDL: 52.6 mg/dL (ref >39.00)
LDL Cholesterol: 147 mg/dL — ABNORMAL HIGH (ref 0–99)
NonHDL: 172.47
Total CHOL/HDL Ratio: 4
Triglycerides: 126 mg/dL (ref 0.0–149.0)
VLDL: 25.2 mg/dL (ref 0.0–40.0)

## 2020-05-05 NOTE — Progress Notes (Signed)
Per orders of Dr. Einar Pheasant, injection of tetanus given by Randall An. Patient tolerated injection well.

## 2021-03-02 ENCOUNTER — Other Ambulatory Visit: Payer: Self-pay | Admitting: Family Medicine

## 2021-03-02 DIAGNOSIS — Z1231 Encounter for screening mammogram for malignant neoplasm of breast: Secondary | ICD-10-CM

## 2021-03-09 ENCOUNTER — Ambulatory Visit (INDEPENDENT_AMBULATORY_CARE_PROVIDER_SITE_OTHER): Payer: BC Managed Care – PPO | Admitting: Family Medicine

## 2021-03-09 ENCOUNTER — Other Ambulatory Visit: Payer: Self-pay

## 2021-03-09 ENCOUNTER — Encounter: Payer: Self-pay | Admitting: Family Medicine

## 2021-03-09 VITALS — BP 100/70 | HR 68 | Temp 97.6°F | Ht 63.0 in | Wt 149.0 lb

## 2021-03-09 DIAGNOSIS — Z1159 Encounter for screening for other viral diseases: Secondary | ICD-10-CM

## 2021-03-09 DIAGNOSIS — Z0000 Encounter for general adult medical examination without abnormal findings: Secondary | ICD-10-CM | POA: Diagnosis not present

## 2021-03-09 DIAGNOSIS — E538 Deficiency of other specified B group vitamins: Secondary | ICD-10-CM

## 2021-03-09 DIAGNOSIS — L659 Nonscarring hair loss, unspecified: Secondary | ICD-10-CM | POA: Diagnosis not present

## 2021-03-09 DIAGNOSIS — E782 Mixed hyperlipidemia: Secondary | ICD-10-CM

## 2021-03-09 DIAGNOSIS — G250 Essential tremor: Secondary | ICD-10-CM | POA: Diagnosis not present

## 2021-03-09 LAB — CBC WITH DIFFERENTIAL/PLATELET
Basophils Absolute: 0 10*3/uL (ref 0.0–0.1)
Basophils Relative: 0.7 % (ref 0.0–3.0)
Eosinophils Absolute: 0.2 10*3/uL (ref 0.0–0.7)
Eosinophils Relative: 3.4 % (ref 0.0–5.0)
HCT: 38.1 % (ref 36.0–46.0)
Hemoglobin: 13.1 g/dL (ref 12.0–15.0)
Lymphocytes Relative: 30.7 % (ref 12.0–46.0)
Lymphs Abs: 1.6 10*3/uL (ref 0.7–4.0)
MCHC: 34.3 g/dL (ref 30.0–36.0)
MCV: 86.6 fl (ref 78.0–100.0)
Monocytes Absolute: 0.3 10*3/uL (ref 0.1–1.0)
Monocytes Relative: 4.9 % (ref 3.0–12.0)
Neutro Abs: 3.1 10*3/uL (ref 1.4–7.7)
Neutrophils Relative %: 60.3 % (ref 43.0–77.0)
Platelets: 294 10*3/uL (ref 150.0–400.0)
RBC: 4.4 Mil/uL (ref 3.87–5.11)
RDW: 12.2 % (ref 11.5–15.5)
WBC: 5.2 10*3/uL (ref 4.0–10.5)

## 2021-03-09 LAB — HEMOGLOBIN A1C: Hgb A1c MFr Bld: 5.2 % (ref 4.6–6.5)

## 2021-03-09 LAB — COMPREHENSIVE METABOLIC PANEL
ALT: 9 U/L (ref 0–35)
AST: 11 U/L (ref 0–37)
Albumin: 4.4 g/dL (ref 3.5–5.2)
Alkaline Phosphatase: 82 U/L (ref 39–117)
BUN: 14 mg/dL (ref 6–23)
CO2: 32 mEq/L (ref 19–32)
Calcium: 9.8 mg/dL (ref 8.4–10.5)
Chloride: 103 mEq/L (ref 96–112)
Creatinine, Ser: 0.6 mg/dL (ref 0.40–1.20)
GFR: 102.93 mL/min (ref >60.00)
Glucose, Bld: 89 mg/dL (ref 70–99)
Potassium: 4.7 mEq/L (ref 3.5–5.1)
Sodium: 140 mEq/L (ref 135–145)
Total Bilirubin: 0.4 mg/dL (ref 0.2–1.2)
Total Protein: 7 g/dL (ref 6.0–8.3)

## 2021-03-09 LAB — LIPID PANEL
Cholesterol: 233 mg/dL — ABNORMAL HIGH (ref 0–200)
HDL: 62.4 mg/dL (ref >39.00)
LDL Cholesterol: 148 mg/dL — ABNORMAL HIGH (ref 0–99)
NonHDL: 170.74
Total CHOL/HDL Ratio: 4
Triglycerides: 115 mg/dL (ref 0.0–149.0)
VLDL: 23 mg/dL (ref 0.0–40.0)

## 2021-03-09 LAB — TSH: TSH: 1.34 u[IU]/mL (ref 0.35–4.50)

## 2021-03-09 LAB — FERRITIN: Ferritin: 35.1 ng/mL (ref 10.0–291.0)

## 2021-03-09 LAB — VITAMIN B12: Vitamin B-12: 993 pg/mL — ABNORMAL HIGH (ref 211–911)

## 2021-03-09 LAB — VITAMIN D 25 HYDROXY (VIT D DEFICIENCY, FRACTURES): VITD: 34.32 ng/mL (ref 30.00–100.00)

## 2021-03-09 NOTE — Progress Notes (Signed)
Annual Exam   Chief Complaint:  Chief Complaint  Patient presents with  . Annual Exam    & Labs requested by neurologist     History of Present Illness:  Ms. Christine Saunders is a 55 y.o. No obstetric history on file. who LMP was Patient's last menstrual period was 01/03/2019., presents today for her annual examination.     Nutrition She does get adequate calcium and Vitamin D in her diet. Diet: vegetarian  Exercise: not currently, due to the weather - [phone] steps  Safety The patient wears seatbelts: yes.     The patient feels safe at home and in their relationships: yes.   Menstrual:  Symptoms of menopause: none   GYN She is single partner, contraception - post menopausal status.    Cervical Cancer Screening (21-65):   Last Pap:   January 2019 Results were: no abnormalities /neg HPV DNA   Breast Cancer Screening (Age 4-74):  There is no FH of breast cancer. There is no FH of ovarian cancer. BRCA screening Not Indicated.  Last Mammogram: 03/17/2020 The patient does want a mammogram this year.    Colon Cancer Screening:  Age 16-75 yo - benefits outweigh the risk. Adults 64-85 yo who have never been screened benefit.  Benefits: 134000 people in 2016 will be diagnosed and 49,000 will die - early detection helps [place]: Complications 2/2 to colonoscopy High Risk (Colonoscopy): genetic disorder (Lynch syndrome or familial adenomatous polyposis), personal hx of IBD, previous adenomatous polyp, or previous colorectal cancer, FamHx start 10 years before the age at diagnosis, increased in males and black race  Options:  FIT - looks for hemoglobin (blood in the stool) - specific and fairly sensitive - must be done annually Cologuard - looks for DNA and blood - more sensitive - therefore can have more false positives, every 3 years Colonoscopy - every 10 years if normal - sedation, bowl prep, must have someone drive you  Shared decision making and the patient had decided to do  cologuard.   Social History   Tobacco Use  Smoking Status Never Smoker  Smokeless Tobacco Never Used    Lung Cancer Screening (Ages 16-10): not applicable   Weight Wt Readings from Last 3 Encounters:  03/09/21 149 lb (67.6 kg)  03/03/20 150 lb 12 oz (68.4 kg)  01/15/19 148 lb 12.8 oz (67.5 kg)   Patient has normal BMI  BMI Readings from Last 1 Encounters:  03/09/21 26.39 kg/m     Chronic disease screening Blood pressure monitoring:  BP Readings from Last 3 Encounters:  03/09/21 100/70  03/03/20 112/64  01/15/19 106/60    Lipid Monitoring: Indication for screening: age >22, obesity, diabetes, family hx, CV risk factors.  Lipid screening: Yes  Lab Results  Component Value Date   CHOL 225 (H) 05/05/2020   HDL 52.60 05/05/2020   LDLCALC 147 (H) 05/05/2020   LDLDIRECT 146.0 01/02/2018   TRIG 126.0 05/05/2020   CHOLHDL 4 05/05/2020     Diabetes Screening: age >60, overweight, family hx, PCOS, hx of gestational diabetes, at risk ethnicity Diabetes Screening screening: Yes  Lab Results  Component Value Date   HGBA1C 5.3 01/28/2020     Past Medical History:  Diagnosis Date  . Abnormal mammogram, unspecified   . Edema    dependent edema of legs, bilateral  . Encounter for long-term (current) use of other medications   . Mass of breast    bilateral  . Obesity, unspecified   . Palpitations   .  Seizure disorder ([place])   . Specified congenital anomalies of breast   . Unspecified hypertrophic and atrophic condition of skin     Past Surgical History:  Procedure Laterality Date  . ELBOW SURGERY     right, as a child    Prior to Admission medications   Medication Sig Start Date End Date Taking? Authorizing Provider  Biotin 1 MG CAPS Take 5,000 mcg by mouth daily.   Yes [provider]  cholecalciferol (VITAMIN D3) 25 MCG (1000 UNIT) tablet Take 1,000 Units by mouth in the morning and at bedtime.   Yes [provider]  folic acid  (FOLVITE) 1 MG tablet Take 1 mg by mouth daily.   Yes [provider]  primidone (MYSOLINE) 50 MG tablet Take 50 mg by mouth 2 (two) times daily.    Yes [provider]  vitamin B-12 (CYANOCOBALAMIN) 100 MCG tablet Take 1,000 mcg by mouth. Every other day   Yes [provider]    No Known Allergies  Gynecologic History: Patient's last menstrual period was 01/03/2019.  Obstetric History: No obstetric history on file.  Social History   Socioeconomic History  . Marital status: Married    Spouse name: Beverely Risen  . Number of children: 1  . Years of education: Associate Professor  . Highest education level: Not on file  Occupational History  . Occupation: Pharmacist, community    Comment: in [employer]  Tobacco Use  . Smoking status: Never Smoker  . Smokeless tobacco: Never Used  Vaping Use  . Vaping Use: Never used  Substance and Sexual Activity  . Alcohol use: No    Alcohol/week: 0.0 standard drinks  . Drug use: No  . Sexual activity: Yes    Birth control/protection: Post-menopausal  Other Topics Concern  . 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 Diabetes Maternal Grandmother   . Colon cancer Maternal Uncle     Review of  Systems  Constitutional: Negative for chills and fever.  HENT: Negative for congestion and sore throat.   Eyes: Negative for blurred vision and double vision.  Respiratory: Negative for shortness of breath.   Cardiovascular: Negative for chest pain.  Gastrointestinal: Negative for heartburn, nausea and vomiting.  Genitourinary: Negative.   Musculoskeletal: Negative.  Negative for myalgias.  Skin: Negative for rash.  Neurological: Negative for dizziness and headaches.  Endo/Heme/Allergies: Does not bruise/bleed easily.  Psychiatric/Behavioral: Negative for depression. The patient is not nervous/anxious.      Physical Exam BP 100/70   Pulse 68   Temp 97.6 F (36.4  C) (Temporal)   Ht '5\' 3"'  (1.6 m)   Wt 149 lb (67.6 kg)   LMP 01/03/2019   SpO2 100%   BMI 26.39 kg/m    BP Readings from Last 3 Encounters:  03/09/21 100/70  03/03/20 112/64  01/15/19 106/60      Physical Exam Constitutional:      General: She is not in acute distress.    Appearance: She is well-developed. She is not diaphoretic.  HENT:     Head: Normocephalic and atraumatic.     Right Ear: External ear normal.     Left Ear: External ear normal.     Nose: Nose normal.  Eyes:     General: No scleral icterus.    Conjunctiva/sclera: Conjunctivae normal.  Cardiovascular:     Rate and Rhythm: Normal rate and regular rhythm.     Heart sounds: No murmur heard.   Pulmonary:     Effort: Pulmonary effort is normal. No respiratory distress.     Breath sounds: Normal breath sounds. No wheezing.  Abdominal:     General: Bowel sounds are normal. There is no distension.     Palpations: Abdomen is soft. There is no mass.     Tenderness: There is no abdominal tenderness. There is no guarding or rebound.  Musculoskeletal:        General: Normal range of motion.     Cervical back: Neck supple.  Lymphadenopathy:     Cervical: No cervical adenopathy.  Skin:    General: Skin is warm and dry.     Capillary Refill:  Capillary refill takes less than 2 seconds.  Neurological:     Mental Status: She is alert and oriented to person, place, and time.     Deep Tendon Reflexes: Reflexes normal.  Psychiatric:        Behavior: Behavior normal.     Results:  PHQ-9:     Assessment: 55 y.o. No obstetric history on file. female here for routine annual physical examination.  Plan: Problem List Items Addressed This Visit   None     Screening: -- Blood pressure screen normal -- cholesterol screening: will obtain -- Weight screening: overweight: continue to monitor -- Diabetes Screening: will obtain -- Nutrition: Encouraged healthy diet  The 10-year ASCVD risk score Mikey Bussing DC Jr., et al., 2013) is: 1.1%   Values used to calculate the score:     Age: 84 years     Sex: Female     Is Non-Hispanic African American: No     Diabetic: No     Tobacco smoker: No     Systolic Blood Pressure: 478 mmHg     Is BP treated: No     HDL Cholesterol: 52.6 mg/dL     Total Cholesterol: 225 mg/dL  -- Statin therapy for Age 4-75 with CVD risk >7.5%  Psych -- Depression screening (PHQ-9):    Safety -- tobacco screening: not using -- alcohol screening:  low-risk usage. -- no evidence of domestic violence or intimate partner violence.   Cancer Screening -- pap smear not collected per ASCCP guidelines -- family history of breast cancer screening: done. not at high risk. -- Mammogram - scheduled -- Colon cancer (age 28+)-- cologuard up to date - discussed with family history to conisder colonoscopy  Immunizations Immunization History  Administered Date(s) Administered  . Hepatitis A 02/26/2017  . Hepatitis A, Adult 02/21/2018  . Influenza, Quadrivalent, Recombinant, Inj, Pf 10/08/2017  . Influenza, Seasonal, Injecte, Preservative Fre 09/25/2019  .  Influenza-Unspecified 10/05/2014, 10/08/2016, 10/08/2018  . PFIZER(Purple Top)SARS-COV-2 Vaccination 03/03/2020, 03/24/2020  . Td 11/03/2009, 05/05/2020     -- flu vaccine declined -- TDAP q10 years up to date -- Shingles (age >28) will consider -- Covid-19 Vaccine - declined booster   Encouraged healthy diet and exercise. Encouraged regular vision and dental care.    Lesleigh Noe, MD

## 2021-03-09 NOTE — Patient Instructions (Addendum)
For bone health:   1) 800 units of Vitamin D daily 2) Get 1200 mg of elemental calcium --- this is best from your diet. Try to track how much calcium you get on a typical day. You could find ways to add more (dairy products, leafy greens). Take a supplement for whatever you don't typically get so you reach 1200 mg of calcium.  3) Physical activity (ideally weight bearing) - like walking briskly 30 minutes 5 days a week.

## 2021-03-13 LAB — PRIMIDONE, SERUM
PRIMIDONE: 2.6 mg/L — ABNORMAL LOW (ref 5.0–12.0)
Phenobarbital, Serum: <5 mg/L — ABNORMAL LOW (ref 15.0–40.0)

## 2021-03-13 LAB — HEPATITIS C ANTIBODY
Hepatitis C Ab: NONREACTIVE
SIGNAL TO CUT-OFF: 0.01 (ref <1.00)

## 2021-03-14 ENCOUNTER — Telehealth: Payer: Self-pay | Admitting: Family Medicine

## 2021-03-14 NOTE — Telephone Encounter (Signed)
Pt called in wanted to get her lab results sent to her doctor at [facility] clinic. Please email medical release form to [email or web address] - The results needs to go to  Dr. Maudry Diego [facility]

## 2021-03-16 NOTE — Telephone Encounter (Signed)
This has been emailed to patient with instructions to complete and return to our office for our copying service to process.

## 2021-03-16 NOTE — Telephone Encounter (Signed)
I have emailed this over again to patient

## 2021-03-16 NOTE — Telephone Encounter (Signed)
Patient called and stated that she never received this via e-mail and wanted to know if it can be resent? Please advise.

## 2021-04-27 ENCOUNTER — Other Ambulatory Visit: Payer: Self-pay

## 2021-04-27 ENCOUNTER — Ambulatory Visit
Admission: RE | Admit: 2021-04-27 | Discharge: 2021-04-27 | Disposition: A | Discharge status: Home / Self Care | Payer: BC Managed Care – PPO | Source: Ambulatory Visit | Attending: Family Medicine | Admitting: Family Medicine

## 2021-04-27 DIAGNOSIS — Z1231 Encounter for screening mammogram for malignant neoplasm of breast: Secondary | ICD-10-CM

## 2022-03-15 ENCOUNTER — Ambulatory Visit (INDEPENDENT_AMBULATORY_CARE_PROVIDER_SITE_OTHER): Payer: BC Managed Care – PPO | Admitting: Family Medicine

## 2022-03-15 ENCOUNTER — Other Ambulatory Visit: Payer: Self-pay

## 2022-03-15 ENCOUNTER — Other Ambulatory Visit (HOSPITAL_COMMUNITY)
Admission: RE | Admit: 2022-03-15 | Discharge: 2022-03-15 | Disposition: A | Discharge status: Home / Self Care | Payer: BC Managed Care – PPO | Source: Ambulatory Visit | Attending: Family Medicine | Admitting: Family Medicine

## 2022-03-15 VITALS — BP 108/70 | HR 79 | Temp 98.4°F | Ht 63.0 in | Wt 146.2 lb

## 2022-03-15 DIAGNOSIS — Z131 Encounter for screening for diabetes mellitus: Secondary | ICD-10-CM

## 2022-03-15 DIAGNOSIS — G250 Essential tremor: Secondary | ICD-10-CM

## 2022-03-15 DIAGNOSIS — E538 Deficiency of other specified B group vitamins: Secondary | ICD-10-CM

## 2022-03-15 DIAGNOSIS — E559 Vitamin D deficiency, unspecified: Secondary | ICD-10-CM

## 2022-03-15 DIAGNOSIS — Z13220 Encounter for screening for lipoid disorders: Secondary | ICD-10-CM | POA: Diagnosis not present

## 2022-03-15 DIAGNOSIS — Z124 Encounter for screening for malignant neoplasm of cervix: Secondary | ICD-10-CM

## 2022-03-15 DIAGNOSIS — Z0000 Encounter for general adult medical examination without abnormal findings: Secondary | ICD-10-CM

## 2022-03-15 DIAGNOSIS — D259 Leiomyoma of uterus, unspecified: Secondary | ICD-10-CM

## 2022-03-15 LAB — CBC WITH DIFFERENTIAL/PLATELET
Basophils Absolute: 0 10*3/uL (ref 0.0–0.1)
Basophils Relative: 0.7 % (ref 0.0–3.0)
Eosinophils Absolute: 0.2 10*3/uL (ref 0.0–0.7)
Eosinophils Relative: 3.2 % (ref 0.0–5.0)
HCT: 38.2 % (ref 36.0–46.0)
Hemoglobin: 12.9 g/dL (ref 12.0–15.0)
Lymphocytes Relative: 29.9 % (ref 12.0–46.0)
Lymphs Abs: 1.5 10*3/uL (ref 0.7–4.0)
MCHC: 33.9 g/dL (ref 30.0–36.0)
MCV: 87.1 fl (ref 78.0–100.0)
Monocytes Absolute: 0.2 10*3/uL (ref 0.1–1.0)
Monocytes Relative: 4.6 % (ref 3.0–12.0)
Neutro Abs: 3.1 10*3/uL (ref 1.4–7.7)
Neutrophils Relative %: 61.6 % (ref 43.0–77.0)
Platelets: 286 10*3/uL (ref 150.0–400.0)
RBC: 4.38 Mil/uL (ref 3.87–5.11)
RDW: 12.6 % (ref 11.5–15.5)
WBC: 5 10*3/uL (ref 4.0–10.5)

## 2022-03-15 LAB — VITAMIN B12: Vitamin B-12: 276 pg/mL (ref 211–911)

## 2022-03-15 LAB — LIPID PANEL
Cholesterol: 241 mg/dL — ABNORMAL HIGH (ref 0–200)
HDL: 77.1 mg/dL (ref >39.00)
LDL Cholesterol: 146 mg/dL — ABNORMAL HIGH (ref 0–99)
NonHDL: 164.13
Total CHOL/HDL Ratio: 3
Triglycerides: 89 mg/dL (ref 0.0–149.0)
VLDL: 17.8 mg/dL (ref 0.0–40.0)

## 2022-03-15 LAB — COMPREHENSIVE METABOLIC PANEL
ALT: 10 U/L (ref 0–35)
AST: 13 U/L (ref 0–37)
Albumin: 4.6 g/dL (ref 3.5–5.2)
Alkaline Phosphatase: 93 U/L (ref 39–117)
BUN: 14 mg/dL (ref 6–23)
CO2: 31 mEq/L (ref 19–32)
Calcium: 9.9 mg/dL (ref 8.4–10.5)
Chloride: 101 mEq/L (ref 96–112)
Creatinine, Ser: 0.6 mg/dL (ref 0.40–1.20)
GFR: 102.2 mL/min (ref >60.00)
Glucose, Bld: 91 mg/dL (ref 70–99)
Potassium: 4.7 mEq/L (ref 3.5–5.1)
Sodium: 138 mEq/L (ref 135–145)
Total Bilirubin: 0.4 mg/dL (ref 0.2–1.2)
Total Protein: 7 g/dL (ref 6.0–8.3)

## 2022-03-15 LAB — TSH: TSH: 1.54 u[IU]/mL (ref 0.35–5.50)

## 2022-03-15 LAB — HEMOGLOBIN A1C: Hgb A1c MFr Bld: 5.5 % (ref 4.6–6.5)

## 2022-03-15 LAB — VITAMIN D 25 HYDROXY (VIT D DEFICIENCY, FRACTURES): VITD: 31.79 ng/mL (ref 30.00–100.00)

## 2022-03-15 NOTE — Progress Notes (Signed)
Annual Exam   Chief Complaint:  Chief Complaint  Patient presents with   Annual Exam    No concerns    History of Present Illness:  Christine Saunders is a 56 y.o. No obstetric history on file. who LMP was Patient's last menstrual period was 01/03/2019., presents today for her annual examination.    Fast heart rate with activity  Nutrition She does get adequate calcium and Vitamin D in her diet. Diet: avoiding processed food, more fruits/veggies Exercise: walking    Social History   Tobacco Use  Smoking Status Never  Smokeless Tobacco Never   Social History   Substance and Sexual Activity  Alcohol Use No   Alcohol/week: 0.0 standard drinks   Social History   Substance and Sexual Activity  Drug Use No     General Health Dentist in the last year: Yes Eye doctor: yes  Safety The patient wears seatbelts: yes.     The patient feels safe at home and in their relationships: yes.   Menstrual:  Symptoms of menopause: no symptoms  GYN She is single partner, contraception - post menopausal status.    Cervical Cancer Screening (21-65):   Last Pap:   January 2019 Results were: no abnormalities /neg HPV DNA   Breast Cancer Screening (Age 32-74):  There is FH of breast cancer. There is no FH of ovarian cancer. BRCA screening Not Indicated.  Last Mammogram: 04/2021 The patient does want a mammogram this year.    Colon Cancer Screening:  Age 36-75 yo - benefits outweigh the risk. Adults 30-85 yo who have never been screened benefit.  Benefits: 134000 people in 2016 will be diagnosed and 49,000 will die - early detection helps [place]: Complications 2/2 to colonoscopy High Risk (Colonoscopy): genetic disorder (Lynch syndrome or familial adenomatous polyposis), personal hx of IBD, previous adenomatous polyp, or previous colorectal cancer, FamHx start 10 years before the age at diagnosis, increased in males and black race  Options:  FIT - looks for hemoglobin (blood in  the stool) - specific and fairly sensitive - must be done annually Cologuard - looks for DNA and blood - more sensitive - therefore can have more false positives, every 3 years Colonoscopy - every 10 years if normal - sedation, bowl prep, must have someone drive you  Shared decision making and the patient had decided to do cologuard.   Social History   Tobacco Use  Smoking Status Never  Smokeless Tobacco Never    Lung Cancer Screening (Ages 37-80): not applicable   Weight Wt Readings from Last 3 Encounters:  03/15/22 146 lb 4 oz (66.3 kg)  03/09/21 149 lb (67.6 kg)  03/03/20 150 lb 12 oz (68.4 kg)   Patient has normal BMI  BMI Readings from Last 1 Encounters:  03/15/22 25.91 kg/m     Chronic disease screening Blood pressure monitoring:  BP Readings from Last 3 Encounters:  03/15/22 108/70  03/09/21 100/70  03/03/20 112/64    Lipid Monitoring: Indication for screening: age >34, obesity, diabetes, family hx, CV risk factors.  Lipid screening: Yes  Lab Results  Component Value Date   CHOL 233 (H) 03/09/2021   HDL 62.40 03/09/2021   LDLCALC 148 (H) 03/09/2021   LDLDIRECT 146.0 01/02/2018   TRIG 115.0 03/09/2021   CHOLHDL 4 03/09/2021     Diabetes Screening: age >52, overweight, family hx, PCOS, hx of gestational diabetes, at risk ethnicity Diabetes Screening screening: Yes  Lab Results  Component Value Date   HGBA1C  5.2 03/09/2021     Past Medical History:  Diagnosis Date   Abnormal mammogram, unspecified    Edema    dependent edema of legs, bilateral   Encounter for long-term (current) use of other medications    Mass of breast    bilateral   Obesity, unspecified    Palpitations    Seizure disorder (HCC)    Specified congenital anomalies of breast    Unspecified hypertrophic and atrophic condition of skin     Past Surgical History:  Procedure Laterality Date   ELBOW SURGERY     right, as a child    Prior to Admission medications    Medication Sig Start Date End Date Taking? Authorizing Provider  cholecalciferol (VITAMIN D3) 25 MCG (1000 UNIT) tablet Take 1,000 Units by mouth in the morning and at bedtime.   Yes [provider]  folic acid (FOLVITE) 1 MG tablet Take 1 mg by mouth daily.   Yes [provider]  primidone (MYSOLINE) 50 MG tablet Take 50 mg by mouth 2 (two) times daily.    Yes [provider]    No Known Allergies  Gynecologic History: Patient's last menstrual period was 01/03/2019.  Obstetric History: No obstetric history on file.  Social History   Socioeconomic History   Marital status: Married    Spouse name: Wyvonnia Lora   Number of children: 1   Years of education: Dental School   Highest education level: Not on file  Occupational History   Occupation: dentist    Comment: in [employer]  Tobacco Use   Smoking status: Never   Smokeless tobacco: Never  Vaping Use   Vaping Use: Never used  Substance and Sexual Activity   Alcohol use: No    Alcohol/week: 0.0 standard drinks   Drug use: No   Sexual activity: Yes    Birth control/protection: Post-menopausal  Other Topics Concern   Not on file  Social History Narrative   03/03/20   From: from [place], moved to [place] in college   Living: with husband, Christine Saunders 1996   Work: Education officer, community in [place]       Family: Counsellor - son - in college      Enjoys: get outside, got to [employer]       Exercise: walking - 5000-10000 steps per day   Diet: fruits/veggies, avoiding oil/cheese, some milk, decreased coconut      Safety   Seat belts: Yes    Guns: Yes  and secure   Safe in relationships: Yes    Social Determinants of Corporate investment banker Strain: Not on file  Food Insecurity: Not on file  Transportation Needs: Not on file  Physical Activity: Not on file  Stress: Not on file  Social Connections: Not on file  Intimate Partner Violence: Not on file    Family History  Problem Relation Age of  Onset   Diabetes Maternal Grandmother    Colon cancer Maternal Uncle 50   Breast cancer Paternal Grandmother     Review of Systems  Constitutional:  Negative for chills and fever.  HENT:  Negative for congestion and sore throat.   Eyes:  Negative for blurred vision and double vision.  Respiratory:  Negative for shortness of breath.   Cardiovascular:  Negative for chest pain.  Gastrointestinal:  Negative for heartburn, nausea and vomiting.  Genitourinary: Negative.   Musculoskeletal: Negative.  Negative for myalgias.  Skin:  Negative for rash.  Neurological:  Negative for dizziness and headaches.  Endo/Heme/Allergies:  Does not bruise/bleed easily.  Psychiatric/Behavioral:  Negative for depression. The patient is not nervous/anxious.     Physical Exam BP 108/70   Pulse 79   Temp 98.4 F (36.9 C) (Oral)   Ht 5\' 3"  (1.6 m)   Wt 146 lb 4 oz (66.3 kg)   LMP 01/03/2019   SpO2 100%   BMI 25.91 kg/m    BP Readings from Last 3 Encounters:  03/15/22 108/70  03/09/21 100/70  03/03/20 112/64      Physical Exam Constitutional:      General: She is not in acute distress.    Appearance: She is well-developed. She is not diaphoretic.  HENT:     Head: Normocephalic and atraumatic.     Right Ear: External ear normal.     Left Ear: External ear normal.     Nose: Nose normal.  Eyes:     General: No scleral icterus.    Extraocular Movements: Extraocular movements intact.     Conjunctiva/sclera: Conjunctivae normal.  Cardiovascular:     Rate and Rhythm: Normal rate and regular rhythm.     Heart sounds: No murmur heard. Pulmonary:     Effort: Pulmonary effort is normal. No respiratory distress.     Breath sounds: Normal breath sounds. No wheezing.  Abdominal:     General: Bowel sounds are normal. There is no distension.     Palpations: Abdomen is soft. There is no mass.     Tenderness: There is no abdominal tenderness. There is no guarding or rebound.  Musculoskeletal:         General: Normal range of motion.     Cervical back: Neck supple.  Lymphadenopathy:     Cervical: No cervical adenopathy.  Skin:    General: Skin is warm and dry.     Capillary Refill: Capillary refill takes less than 2 seconds.  Neurological:     Mental Status: She is alert and oriented to person, place, and time.     Deep Tendon Reflexes: Reflexes normal.  Psychiatric:        Mood and Affect: Mood normal.        Behavior: Behavior normal.    Results:  PHQ-9:     03/15/2022    8:47 AM 03/09/2021    9:04 AM 03/03/2020   10:45 AM  Depression screen PHQ 2/9  Decreased Interest 0 0 0  Down, Depressed, Hopeless 0 0 0  PHQ - 2 Score 0 0 0       Assessment: 56 y.o. No obstetric history on file. female here for routine annual physical examination.  Plan: Problem List Items Addressed This Visit       Nervous and Auditory   Benign essential tremor   Relevant Orders   Primidone, Serum   TSH     Genitourinary   Uterine fibroid   Relevant Orders   CBC with Differential     Other   Vitamin B12 deficiency   Relevant Orders   Vitamin B12   Other Visit Diagnoses     Annual physical exam    -  Primary   Relevant Orders   Comprehensive metabolic panel   Screening for diabetes mellitus       Relevant Orders   Hemoglobin A1c   Screening for hyperlipidemia       Relevant Orders   Lipid panel   Vitamin D deficiency       Relevant Orders   Vitamin D, 25-hydroxy   Cervical cancer  screening       Relevant Orders   Cytology - PAP       Screening: -- Blood pressure screen normal -- cholesterol screening: will obtain -- Weight screening: normal -- Diabetes Screening: will obtain -- Nutrition: Encouraged healthy diet  The 10-year ASCVD risk score (Arnett DK, et al., 2019) is: 1.2%   Values used to calculate the score:     Age: 52 years     Sex: Female     Is Non-Hispanic African American: No     Diabetic: No     Tobacco smoker: No     Systolic Blood  Pressure: 108 mmHg     Is BP treated: No     HDL Cholesterol: 62.4 mg/dL     Total Cholesterol: 233 mg/dL  -- Statin therapy for Age 51-75 with CVD risk >7.5%  Psych -- Depression screening (PHQ-9):     03/15/2022    8:47 AM 03/09/2021    9:04 AM 03/03/2020   10:45 AM  Depression screen PHQ 2/9  Decreased Interest 0 0 0  Down, Depressed, Hopeless 0 0 0  PHQ - 2 Score 0 0 0      Safety -- tobacco screening: not using -- alcohol screening:  low-risk usage. -- no evidence of domestic violence or intimate partner violence.   Cancer Screening -- pap smear collected per ASCCP guidelines -- family history of breast cancer screening: done. not at high risk. -- Mammogram -  she will schedule -- Colon cancer (age 55+)--  she will update with who she wants to see  Immunizations Immunization History  Administered Date(s) Administered   Hepatitis A 02/26/2017   Hepatitis A, Adult 02/21/2018   Influenza, Quadrivalent, Recombinant, Inj, Pf 10/08/2017   Influenza, Seasonal, Injecte, Preservative Fre 09/25/2019   Influenza-Unspecified 10/05/2014, 10/08/2016, 10/08/2018   PFIZER Comirnaty(Gray Top)Covid-19 Tri-Sucrose Vaccine 03/12/2020, 04/12/2020   PFIZER(Purple Top)SARS-COV-2 Vaccination 03/03/2020, 03/24/2020   Td 11/03/2009, 05/05/2020    -- flu vaccine up to date -- TDAP q10 years up to date -- Shingles (age >50) not up to date - she will consider getting -- Covid-19 Vaccine up to date   Encouraged healthy diet and exercise. Encouraged regular vision and dental care.    Lynnda Child, MD

## 2022-03-15 NOTE — Patient Instructions (Addendum)
Continue healthy diet ? ?Work on regular exercise ? ?Send me a Pharmacist, community when you decide which Gastroenterologist you want to see for colon cancer screening ? ?Labs today ? ? ?

## 2022-03-19 LAB — PRIMIDONE, SERUM
PRIMIDONE: 2.7 mg/L — ABNORMAL LOW (ref 5.0–12.0)
Phenobarbital, Serum: <5 mg/L — ABNORMAL LOW (ref 15.0–40.0)

## 2022-03-21 LAB — CYTOLOGY - PAP
Adequacy: ABSENT
Comment: NEGATIVE
Diagnosis: NEGATIVE
High risk HPV: NEGATIVE

## 2022-05-18 ENCOUNTER — Other Ambulatory Visit: Payer: Self-pay | Admitting: Family Medicine

## 2022-05-18 DIAGNOSIS — Z1231 Encounter for screening mammogram for malignant neoplasm of breast: Secondary | ICD-10-CM

## 2022-06-14 ENCOUNTER — Ambulatory Visit: Payer: BC Managed Care – PPO

## 2022-06-21 ENCOUNTER — Ambulatory Visit
Admission: RE | Admit: 2022-06-21 | Discharge: 2022-06-21 | Disposition: A | Discharge status: Home / Self Care | Payer: BC Managed Care – PPO | Source: Ambulatory Visit | Attending: Family Medicine | Admitting: Family Medicine

## 2022-06-21 DIAGNOSIS — Z1231 Encounter for screening mammogram for malignant neoplasm of breast: Secondary | ICD-10-CM

## 2022-11-01 ENCOUNTER — Ambulatory Visit: Payer: BC Managed Care – PPO | Admitting: Dermatology

## 2022-11-01 DIAGNOSIS — L639 Alopecia areata, unspecified: Secondary | ICD-10-CM

## 2022-11-01 DIAGNOSIS — R202 Paresthesia of skin: Secondary | ICD-10-CM

## 2022-11-01 DIAGNOSIS — L659 Nonscarring hair loss, unspecified: Secondary | ICD-10-CM

## 2022-11-01 MED ORDER — FLUOCINOLONE ACETONIDE SCALP 0.01 % EX OIL: TOPICAL_OIL | CUTANEOUS | 2 refills | Status: DC

## 2022-11-01 NOTE — Progress Notes (Signed)
New Patient Visit  Subjective  Christine Saunders is a 56 y.o. female who presents for the following: Skin Problem (Patient c/o hair loss on her scalp, she recently noticed 1 week ago when she went to see her hair stylist. ). Also with dark itchy spot on the back. Husband with patient   The following portions of the chart were reviewed this encounter and updated as appropriate:   Tobacco  Allergies  Meds  Problems  Med Hx  Surg Hx  Fam Hx     Review of Systems:  No other skin or systemic complaints except as noted in HPI or Assessment and Plan.  Objective  Well appearing patient in no apparent distress; mood and affect are within normal limits.  A focused examination was performed including scalp, legs,back . Relevant physical exam findings are noted in the Assessment and Plan.  Scalp 3 x 4 cm pink patch at left post scalp 3 x 2.5 cm pink patch at the right post scalp Some smooth pink with gray colored regrowth   Mid Back Hyperpigmented plaque with lichenification     Assessment & Plan  Alopecia areata Scalp    Alopecia areata is a chronic autoimmune condition localized to the skin which affects hair follicles and causes hair loss, most commonly in the scalp.  Cause is unknown.  Can be unpredictable, difficult to treat, and may recur.  Treatments may include topical and intralesional steroids to decrease inflammation to allow for hair regrowth.  Other treatments may include narrowband ultraviolet B light treatment; topical Squairic acid immunotherapy application; topical or oral Minoxidil; antihistamines and oral Jak inhibitors.  Labs reviewed including thyroid, chemistries with liver and kidney and CBC and others dated 03/15/2022 WNL   The patient declines oral treatment at this time.  Start Fluocinolone oil apply to scalp once or twice a day 5 days a week, Avoid applying to face, groin, and axilla. Use as directed. Long-term use can cause thinning of the skin.   Topical  steroids (such as triamcinolone, fluocinolone, fluocinonide, mometasone, clobetasol, halobetasol, betamethasone, hydrocortisone) can cause thinning and lightening of the skin if they are used for too long in the same area. Your physician has selected the right strength medicine for your problem and area affected on the body. Please use your medication only as directed by your physician to prevent side effects.    Related Medications Fluocinolone Acetonide Scalp 0.01 % OIL Apply to affected areas on the scalp once or twice a day 5 days a week, Avoid applying to face, groin, and axilla. Use as directed. Long-term use can cause thinning of the skin.  Notalgia paresthetica Mid Back  Notalgia paresthetica is a chronic condition affecting the skin of the back in which a pinched nerve along the spine causes itching or changes in sensation in an area of skin. This is usually accompanied by chronic rubbing or scratching often leaving the area of skin discolored and thickened. There is no cure, but there are some treatments which may help control the itch.   Over the counter (non-prescription) treatments for notalgia paresthetica include numbing creams like pramoxine or lidocaine which temporarily reduce itch or Capsaicin-containing creams which cause a burning sensation but which sometimes over time will reset the nerves to stop producing itch.  If you choose to use Capsaicin cream, it is recommended to use it 5 times daily for 1 week followed by 3 times daily for 3-6 weeks. You may have to continue using it long-term. For severe  cases, there are some prescription cream or pill options which may help. Other treatment options include: - Transcutaneous Electrical Nerve Stimulation (TENS) - Gabapentin 300-900 mg daily po - Amitriptyline orally - Paravertebral local anesthetic block - intralesional Botulinum toxin A   Start Fluocinolone scalp solution apply to scalp qd-bid 5 days a week, Avoid applying to  face, groin, and axilla. Use as directed. Long-term use can cause thinning of the skin.   Related Medications Fluocinolone Acetonide Scalp 0.01 % OIL Apply to affected areas on the scalp once or twice a day 5 days a week, Avoid applying to face, groin, and axilla. Use as directed. Long-term use can cause thinning of the skin.  Return in about 3 months (around 02/01/2023) for Alopecia .  IMarye Round, CMA, am acting as scribe for Sarina Ser, MD .  Documentation: I have reviewed the above documentation for accuracy and completeness, and I agree with the above.  Sarina Ser, MD

## 2022-11-01 NOTE — Patient Instructions (Addendum)
Discussed oral Jak inhibitors- may consider in the future if no better using topicals.  Topical steroids (such as triamcinolone, fluocinolone, fluocinonide, mometasone, clobetasol, halobetasol, betamethasone, hydrocortisone) can cause thinning and lightening of the skin if they are used for too long in the same area. Your physician has selected the right strength medicine for your problem and area affected on the body. Please use your medication only as directed by your physician to prevent side effects.     Due to recent changes in healthcare laws, you may see results of your pathology and/or laboratory studies on MyChart before the doctors have had a chance to review them. We understand that in some cases there may be results that are confusing or concerning to you. Please understand that not all results are received at the same time and often the doctors may need to interpret multiple results in order to provide you with the best plan of care or course of treatment. Therefore, we ask that you please give [place] 2 business days to thoroughly review all your results before contacting the office for clarification. Should we see a critical lab result, you will be contacted sooner.   If You Need Anything After Your Visit  If you have any questions or concerns for your doctor, please call our main line at [phone] and press option 4 to reach your doctor's medical assistant. If no one answers, please leave a voicemail as directed and we will return your call as soon as possible. Messages left after 4 pm will be answered the following business day.   You may also send [place] a message via [employer]. We typically respond to MyChart messages within 1-2 business days.  For prescription refills, please ask your pharmacy to contact our office. Our fax number is [phone].  If you have an urgent issue when the clinic is closed that cannot wait until the next business day, you can page your doctor at the number  below.    Please note that while we do our best to be available for urgent issues outside of office hours, we are not available 24/7.   If you have an urgent issue and are unable to reach [place], you may choose to seek medical care at your doctor's office, retail clinic, urgent care center, or emergency room.  If you have a medical emergency, please immediately call 911 or go to the emergency department.  Pager Numbers  - Dr. Nehemiah Massed: [phone]  - Dr. Laurence Ferrari: [phone]  - Dr. Nicole Kindred: [phone]  In the event of inclement weather, please call our main line at [phone] for an update on the status of any delays or closures.  Dermatology Medication Tips: Please keep the boxes that topical medications come in in order to help keep track of the instructions about where and how to use these. Pharmacies typically print the medication instructions only on the boxes and not directly on the medication tubes.   If your medication is too expensive, please contact our office at [phone] option 4 or send [place] a message through [place].   We are unable to tell what your co-pay for medications will be in advance as this is different depending on your insurance coverage. However, we may be able to find a substitute medication at lower cost or fill out paperwork to get insurance to cover a needed medication.   If a prior authorization is required to get your medication covered by your insurance company, please allow [place] 1-2 business days to complete this  process.  Drug prices often vary depending on where the prescription is filled and some pharmacies may offer cheaper prices.  The website www.goodrx.com contains coupons for medications through different pharmacies. The prices here do not account for what the cost may be with help from insurance (it may be cheaper with your insurance), but the website can give you the price if you did not use any insurance.  - You can print the associated coupon  and take it with your prescription to the pharmacy.  - You may also stop by our office during regular business hours and pick up a GoodRx coupon card.  - If you need your prescription sent electronically to a different pharmacy, notify our office through [facility] or by phone at [phone] option 4.     Si Usted Necesita Algo Despus de Su Visita  Tambin puede enviarnos un mensaje a travs de Pharmacist, community. Por lo general respondemos a los mensajes de MyChart en el transcurso de 1 a 2 das hbiles.  Para renovar recetas, por favor pida a su farmacia que se ponga en contacto con nuestra oficina. Harland Dingwall de fax es [place] [phone].  Si tiene un asunto urgente cuando la clnica est cerrada y que no puede esperar hasta el siguiente da hbil, puede llamar/localizar a su doctor(a) al nmero que aparece a continuacin.   Por favor, tenga en cuenta que aunque hacemos todo lo posible para estar disponibles para asuntos urgentes fuera del horario de [place], no estamos disponibles las 24 horas del da, los 7 das de la [place].   Si tiene un problema urgente y no puede comunicarse con nosotros, puede optar por buscar atencin mdica  en el consultorio de su doctor(a), en una clnica privada, en un centro de atencin urgente o en una sala de emergencias.  Si tiene Engineering geologist, por favor llame inmediatamente al 911 o vaya a la sala de emergencias.  Nmeros de bper  - Dr. Nehemiah Massed: [phone]  - Dra. Moye: [phone]  - Dra. Nicole Kindred: [phone]  En caso de inclemencias del [place], por favor llame a Johnsie Kindred principal al [phone] para una actualizacin sobre el [place] de cualquier retraso o cierre.  Consejos para la medicacin en dermatologa: Por favor, guarde las cajas en las que vienen los medicamentos de uso tpico para ayudarle a seguir las instrucciones sobre dnde y cmo usarlos. Las farmacias generalmente imprimen las instrucciones del medicamento  slo en las cajas y no directamente en los tubos del [place].   Si su medicamento es muy caro, por favor, pngase en contacto con Zigmund Daniel llamando al [phone] y presione la opcin 4 o envenos un mensaje a travs de Pharmacist, community.   No podemos decirle cul ser su copago por los medicamentos por adelantado ya que esto es diferente dependiendo de la cobertura de su seguro. Sin embargo, es posible que podamos encontrar un medicamento sustituto a Electrical engineer un formulario para que el seguro cubra el medicamento que se considera necesario.   Si se requiere una autorizacin previa para que su compaa de seguros [place] su medicamento, por favor permtanos de 1 a 2 das hbiles para completar este proceso.  Los precios de los medicamentos varan con frecuencia dependiendo del Environmental consultant de dnde se surte la receta y alguna farmacias pueden ofrecer precios ms baratos.  El sitio web www.goodrx.com tiene cupones para medicamentos de Airline pilot. Los precios aqu no tienen en cuenta lo que podra costar con la ayuda del seguro (puede  barato con su seguro), pero el sitio web puede darle el precio si no utiliz ningn seguro.  - Puede imprimir el cupn correspondiente y llevarlo con su receta a la farmacia.  - Tambin puede pasar por nuestra oficina durante el horario de atencin regular y recoger una tarjeta de cupones de GoodRx.  - Si necesita que su receta se enve electrnicamente a una farmacia diferente, informe a nuestra oficina a travs de MyChart de [place] o por telfono llamando al [phone] y presione la opcin 4.  

## 2022-11-10 ENCOUNTER — Encounter: Payer: Self-pay | Admitting: Dermatology

## 2023-02-07 ENCOUNTER — Ambulatory Visit: Payer: BC Managed Care – PPO | Admitting: Dermatology

## 2023-02-21 ENCOUNTER — Ambulatory Visit: Payer: BC Managed Care – PPO | Admitting: Dermatology

## 2023-02-21 ENCOUNTER — Encounter: Payer: Self-pay | Admitting: Dermatology

## 2023-02-21 VITALS — BP 116/77 | HR 75

## 2023-02-21 DIAGNOSIS — Z7189 Other specified counseling: Secondary | ICD-10-CM | POA: Diagnosis not present

## 2023-02-21 DIAGNOSIS — R202 Paresthesia of skin: Secondary | ICD-10-CM

## 2023-02-21 DIAGNOSIS — L639 Alopecia areata, unspecified: Secondary | ICD-10-CM | POA: Diagnosis not present

## 2023-02-21 DIAGNOSIS — Z79899 Other long term (current) drug therapy: Secondary | ICD-10-CM

## 2023-02-21 NOTE — Progress Notes (Signed)
Follow-Up Visit   Subjective  Christine Saunders is a 57 y.o. female who presents for the following: Alopecia. The patient has spots, moles and lesions to be evaluated, some may be new or changing and the patient has concerns that these could be cancer.  The following portions of the chart were reviewed this encounter and updated as appropriate:  Tobacco  Allergies  Meds  Problems  Med Hx  Surg Hx  Fam Hx     Review of Systems: No other skin or systemic complaints except as noted in HPI or Assessment and Plan.  Objective  Well appearing patient in no apparent distress; mood and affect are within normal limits.  A focused examination was performed including scalp and back. Relevant physical exam findings are noted in the Assessment and Plan.  Scalp 3.5 x 4 cm at right posterior thigh        Assessment & Plan  Notalgia paresthetica Mid Back Notalgia paresthetica is a chronic condition affecting the skin of the back in which a pinched nerve along the spine causes itching or changes in sensation in an area of skin. This is usually accompanied by chronic rubbing or scratching often leaving the area of skin discolored and thickened. There is no cure, but there are some treatments which may help control the itch.   Over the counter (non-prescription) treatments for notalgia paresthetica include numbing creams like pramoxine or lidocaine which temporarily reduce itch or Capsaicin-containing creams which cause a burning sensation but which sometimes over time will reset the nerves to stop producing itch.  If you choose to use Capsaicin cream, it is recommended to use it 5 times daily for 1 week followed by 3 times daily for 3-6 weeks. You may have to continue using it long-term. For severe cases, there are some prescription cream or pill options which may help. Other treatment options include: - Transcutaneous Electrical Nerve Stimulation (TENS) - Gabapentin 300-900 mg daily po -  Amitriptyline orally - Paravertebral local anesthetic block - intralesional Botulinum toxin A   Can continue fluocinolone acetonide scalp 0.01 % oil as directed  Related Medications Fluocinolone Acetonide Scalp 0.01 % OIL Apply to affected areas on the scalp once or twice a day 5 days a week, Avoid applying to face, groin, and axilla. Use as directed. Long-term use can cause thinning of the skin.  Alopecia areata Scalp Has improved compared to previous photos Photos today Can continue Fluocinolone Scalp 0.01 % oil apply to affected areas on the scalp once or twice a day 5 days a week. Avoid applying to face, groin, and axilla. Use as directed. Long-term use can cause thinning of the skin. Pt declines all other treatment for alopecia areata at this time.  She wants to continue the scalp oil only.   Alopecia areata is a chronic autoimmune condition localized to the skin which affects hair follicles and causes hair loss, most commonly in the scalp.  Cause is unknown.  Can be unpredictable, difficult to treat, and may recur.  Treatments may include topical and intralesional steroids to decrease inflammation to allow for hair regrowth.  Other treatments may include narrowband ultraviolet B light treatment; topical Squairic acid immunotherapy application; topical or oral Minoxidil; antihistamines and oral Jak inhibitors.   Return in about 6 months (around 08/22/2023) for alopecia / notalgia paresthetica .  Garry Heater, CMA, am acting as scribe for Sarina Ser, MD. Documentation: I have reviewed the above documentation for accuracy and completeness, and I agree with  the above.  Sarina Ser, MD

## 2023-02-21 NOTE — Patient Instructions (Addendum)
Notalgia paresthetica is a chronic condition affecting the skin of the back in which a pinched nerve along the spine causes itching or changes in sensation in an area of skin. This is usually accompanied by chronic rubbing or scratching often leaving the area of skin discolored and thickened. There is no cure, but there are some treatments which may help control the itch.   Over the counter (non-prescription) treatments for notalgia paresthetica include numbing creams like pramoxine or lidocaine which temporarily reduce itch or Capsaicin-containing creams which cause a burning sensation but which sometimes over time will reset the nerves to stop producing itch.  If you choose to use Capsaicin cream, it is recommended to use it 5 times daily for 1 week followed by 3 times daily for 3-6 weeks. You may have to continue using it long-term. For severe cases, there are some prescription cream or pill options which may help. Other treatment options include: - Transcutaneous Electrical Nerve Stimulation (TENS) - Gabapentin 300-900 mg daily po - Amitriptyline orally - Paravertebral local anesthetic block - intralesional Botulinum toxin A     Can continue Fluocinolone Scalp 0.01 % oil apply to scalp qd-bid 5 days a week, Avoid applying to face, groin, and axilla. Use as directed. Long-term use can cause thinning of the skin.      Alopecia areata is a chronic autoimmune condition localized to the skin which affects hair follicles and causes hair loss, most commonly in the scalp.  Cause is unknown.  Can be unpredictable, difficult to treat, and may recur.  Treatments may include topical and intralesional steroids to decrease inflammation to allow for hair regrowth.  Other treatments may include narrowband ultraviolet B light treatment; topical Squairic acid immunotherapy application; topical or oral Minoxidil; antihistamines and oral Jak inhibitors.    Due to recent changes in healthcare laws, you may see  results of your pathology and/or laboratory studies on MyChart before the doctors have had a chance to review them. We understand that in some cases there may be results that are confusing or concerning to you. Please understand that not all results are received at the same time and often the doctors may need to interpret multiple results in order to provide you with the best plan of care or course of treatment. Therefore, we ask that you please give [place] 2 business days to thoroughly review all your results before contacting the office for clarification. Should we see a critical lab result, you will be contacted sooner.   If You Need Anything After Your Visit  If you have any questions or concerns for your doctor, please call our main line at [phone] and press option 4 to reach your doctor's medical assistant. If no one answers, please leave a voicemail as directed and we will return your call as soon as possible. Messages left after 4 pm will be answered the following business day.   You may also send [place] a message via [place]. We typically respond to MyChart messages within 1-2 business days.  For prescription refills, please ask your pharmacy to contact our office. Our fax number is [phone].  If you have an urgent issue when the clinic is closed that cannot wait until the next business day, you can page your doctor at the number below.    Please note that while we do our best to be available for urgent issues outside of office hours, we are not available 24/7.   If you have an urgent issue and  are unable to reach [place], you may choose to seek medical care at your doctor's office, retail clinic, urgent care center, or emergency room.  If you have a medical emergency, please immediately call 911 or go to the emergency department.  Pager Numbers  - Dr. Nehemiah Massed: [phone]  - Dr. Laurence Ferrari: [phone]  - Dr. Nicole Kindred: [phone]  In the event of inclement weather, please call our main  line at [phone] for an update on the status of any delays or closures.  Dermatology Medication Tips: Please keep the boxes that topical medications come in in order to help keep track of the instructions about where and how to use these. Pharmacies typically print the medication instructions only on the boxes and not directly on the medication tubes.   If your medication is too expensive, please contact our office at [phone] option 4 or send [place] a message through [place].   We are unable to tell what your co-pay for medications will be in advance as this is different depending on your insurance coverage. However, we may be able to find a substitute medication at lower cost or fill out paperwork to get insurance to cover a needed medication.   If a prior authorization is required to get your medication covered by your insurance company, please allow [place] 1-2 business days to complete this process.  Drug prices often vary depending on where the prescription is filled and some pharmacies may offer cheaper prices.  The website www.goodrx.com contains coupons for medications through different pharmacies. The prices here do not account for what the cost may be with help from insurance (it may be cheaper with your insurance), but the website can give you the price if you did not use any insurance.  - You can print the associated coupon and take it with your prescription to the pharmacy.  - You may also stop by our office during regular business hours and pick up a GoodRx coupon card.  - If you need your prescription sent electronically to a different pharmacy, notify our office through [facility] or by phone at [phone] option 4.     Si Usted Necesita Algo Despus de Su Visita  Tambin puede enviarnos un mensaje a travs de Pharmacist, community. Por lo general respondemos a los mensajes de MyChart en el transcurso de 1 a 2 das hbiles.  Para renovar recetas, por favor pida a su farmacia que  se ponga en contacto con nuestra oficina. Harland Dingwall de fax es [place] [phone].  Si tiene un asunto urgente cuando la clnica est cerrada y que no puede esperar hasta el siguiente da hbil, puede llamar/localizar a su doctor(a) al nmero que aparece a continuacin.   Por favor, tenga en cuenta que aunque hacemos todo lo posible para estar disponibles para asuntos urgentes fuera del horario de [place], no estamos disponibles las 24 horas del da, los 7 das de la [place].   Si tiene un problema urgente y no puede comunicarse con nosotros, puede optar por buscar atencin mdica  en el consultorio de su doctor(a), en una clnica privada, en un centro de atencin urgente o en una sala de emergencias.  Si tiene Engineering geologist, por favor llame inmediatamente al 911 o vaya a la sala de emergencias.  Nmeros de bper  - Dr. Nehemiah Massed: [phone]  - Dra. Moye: [phone]  - Dra. Nicole Kindred: [phone]  En caso de inclemencias del [place], por favor llame a nuestra lnea principal al [phone] para una actualizacin [employer]  estado de cualquier retraso o cierre.  Consejos para la medicacin en dermatologa: Por favor, guarde las cajas en las que vienen los medicamentos de uso tpico para ayudarle a seguir las instrucciones sobre dnde y cmo usarlos. Las farmacias generalmente imprimen las instrucciones del medicamento slo en las cajas y no directamente en los tubos del [place].   Si su medicamento es muy caro, por favor, pngase en contacto con Zigmund Daniel llamando al [phone] y presione la opcin 4 o envenos un mensaje a travs de Pharmacist, community.   No podemos decirle cul ser su copago por los medicamentos por adelantado ya que esto es diferente dependiendo de la cobertura de su seguro. Sin embargo, es posible que podamos encontrar un medicamento sustituto a Electrical engineer un formulario para que el seguro cubra el medicamento que se considera necesario.   Si se  requiere una autorizacin previa para que su compaa de seguros [place] su medicamento, por favor permtanos de 1 a 2 das hbiles para completar este proceso.  Los precios de los medicamentos varan con frecuencia dependiendo del Environmental consultant de dnde se surte la receta y alguna farmacias pueden ofrecer precios ms baratos.  El sitio web www.goodrx.com tiene cupones para medicamentos de Airline pilot. Los precios aqu no tienen en cuenta lo que podra costar con la ayuda del seguro (puede ser ms barato con su seguro), pero el sitio web puede darle el precio si no utiliz Research scientist (physical sciences).  - Puede imprimir el cupn correspondiente y llevarlo con su receta a la farmacia.  - Tambin puede pasar por nuestra oficina durante el horario de atencin regular y Charity fundraiser una tarjeta de cupones de GoodRx.  - Si necesita que su receta se enve electrnicamente a una farmacia diferente, informe a nuestra oficina a travs de MyChart de [place] o por telfono llamando al [phone] y presione la opcin 4.

## 2023-02-28 ENCOUNTER — Encounter: Payer: Self-pay | Admitting: Dermatology

## 2023-03-07 ENCOUNTER — Other Ambulatory Visit: Payer: BC Managed Care – PPO

## 2023-03-21 ENCOUNTER — Encounter: Payer: BC Managed Care – PPO | Admitting: Family Medicine

## 2023-05-16 ENCOUNTER — Other Ambulatory Visit: Payer: Self-pay | Admitting: Family

## 2023-05-16 ENCOUNTER — Encounter: Payer: Self-pay | Admitting: Family

## 2023-05-16 ENCOUNTER — Encounter: Payer: Self-pay | Admitting: *Deleted

## 2023-05-16 ENCOUNTER — Ambulatory Visit (INDEPENDENT_AMBULATORY_CARE_PROVIDER_SITE_OTHER): Payer: BC Managed Care – PPO | Admitting: Family

## 2023-05-16 ENCOUNTER — Other Ambulatory Visit (INDEPENDENT_AMBULATORY_CARE_PROVIDER_SITE_OTHER): Payer: BC Managed Care – PPO

## 2023-05-16 VITALS — BP 122/82 | HR 98 | Temp 97.9°F | Ht 63.0 in | Wt 149.0 lb

## 2023-05-16 DIAGNOSIS — Z789 Other specified health status: Secondary | ICD-10-CM | POA: Diagnosis not present

## 2023-05-16 DIAGNOSIS — E559 Vitamin D deficiency, unspecified: Secondary | ICD-10-CM | POA: Diagnosis not present

## 2023-05-16 DIAGNOSIS — E782 Mixed hyperlipidemia: Secondary | ICD-10-CM | POA: Diagnosis not present

## 2023-05-16 DIAGNOSIS — G40309 Generalized idiopathic epilepsy and epileptic syndromes, not intractable, without status epilepticus: Secondary | ICD-10-CM | POA: Diagnosis not present

## 2023-05-16 DIAGNOSIS — E538 Deficiency of other specified B group vitamins: Secondary | ICD-10-CM

## 2023-05-16 DIAGNOSIS — Z0000 Encounter for general adult medical examination without abnormal findings: Secondary | ICD-10-CM

## 2023-05-16 DIAGNOSIS — G250 Essential tremor: Secondary | ICD-10-CM | POA: Diagnosis not present

## 2023-05-16 DIAGNOSIS — Z1231 Encounter for screening mammogram for malignant neoplasm of breast: Secondary | ICD-10-CM

## 2023-05-16 DIAGNOSIS — Z5181 Encounter for therapeutic drug level monitoring: Secondary | ICD-10-CM

## 2023-05-16 DIAGNOSIS — Z1211 Encounter for screening for malignant neoplasm of colon: Secondary | ICD-10-CM

## 2023-05-16 DIAGNOSIS — Z780 Asymptomatic menopausal state: Secondary | ICD-10-CM | POA: Insufficient documentation

## 2023-05-16 LAB — COMPREHENSIVE METABOLIC PANEL
ALT: 12 U/L (ref 0–35)
AST: 16 U/L (ref 0–37)
Albumin: 4.6 g/dL (ref 3.5–5.2)
Alkaline Phosphatase: 96 U/L (ref 39–117)
BUN: 9 mg/dL (ref 6–23)
CO2: 28 mEq/L (ref 19–32)
Calcium: 9.8 mg/dL (ref 8.4–10.5)
Chloride: 103 mEq/L (ref 96–112)
Creatinine, Ser: 0.61 mg/dL (ref 0.40–1.20)
GFR: 100.96 mL/min (ref >60.00)
Glucose, Bld: 94 mg/dL (ref 70–99)
Potassium: 4.9 mEq/L (ref 3.5–5.1)
Sodium: 139 mEq/L (ref 135–145)
Total Bilirubin: 0.4 mg/dL (ref 0.2–1.2)
Total Protein: 7.1 g/dL (ref 6.0–8.3)

## 2023-05-16 LAB — CBC WITH DIFFERENTIAL/PLATELET
Basophils Absolute: 0 10*3/uL (ref 0.0–0.1)
Basophils Relative: 0.4 % (ref 0.0–3.0)
Eosinophils Absolute: 0.2 10*3/uL (ref 0.0–0.7)
Eosinophils Relative: 3.1 % (ref 0.0–5.0)
HCT: 40.2 % (ref 36.0–46.0)
Hemoglobin: 13.4 g/dL (ref 12.0–15.0)
Lymphocytes Relative: 31.7 % (ref 12.0–46.0)
Lymphs Abs: 1.7 10*3/uL (ref 0.7–4.0)
MCHC: 33.3 g/dL (ref 30.0–36.0)
MCV: 87.5 fl (ref 78.0–100.0)
Monocytes Absolute: 0.2 10*3/uL (ref 0.1–1.0)
Monocytes Relative: 3.9 % (ref 3.0–12.0)
Neutro Abs: 3.4 10*3/uL (ref 1.4–7.7)
Neutrophils Relative %: 60.9 % (ref 43.0–77.0)
Platelets: 302 10*3/uL (ref 150.0–400.0)
RBC: 4.6 Mil/uL (ref 3.87–5.11)
RDW: 12.8 % (ref 11.5–15.5)
WBC: 5.5 10*3/uL (ref 4.0–10.5)

## 2023-05-16 LAB — LIPID PANEL
Cholesterol: 252 mg/dL — ABNORMAL HIGH (ref 0–200)
HDL: 67.5 mg/dL (ref >39.00)
LDL Cholesterol: 162 mg/dL — ABNORMAL HIGH (ref 0–99)
NonHDL: 184.31
Total CHOL/HDL Ratio: 4
Triglycerides: 111 mg/dL (ref 0.0–149.0)
VLDL: 22.2 mg/dL (ref 0.0–40.0)

## 2023-05-16 LAB — VITAMIN B12: Vitamin B-12: 229 pg/mL (ref 211–911)

## 2023-05-16 LAB — TSH: TSH: 1.33 u[IU]/mL (ref 0.35–5.50)

## 2023-05-16 LAB — VITAMIN D 25 HYDROXY (VIT D DEFICIENCY, FRACTURES): VITD: 35.88 ng/mL (ref 30.00–100.00)

## 2023-05-16 MED ORDER — VITAMIN D3 50 MCG (2000 UT) PO CAPS
2000.0000 [IU] | ORAL_CAPSULE | Freq: Every day | ORAL | 3 refills | Status: AC
Start: 2023-05-16 — End: ?

## 2023-05-16 NOTE — Progress Notes (Signed)
Can we add A1c? I think I forgot to put on this order.

## 2023-05-16 NOTE — Progress Notes (Signed)
Subjective:  Patient ID: Christine Saunders, female    DOB: [birth year]  Age: 57 y.o. MRN: [id number]  Patient Care Team: Mort Sawyers, FNP as PCP - General (Family Medicine)   CC:  Chief Complaint  Patient presents with   Establish Care    TOC from Dr Selena Batten, Needs CPE and labs- Fasting today    HPI Christine Saunders is a 57 y.o. female who presents today for an annual physical exam. She reports consuming a  vegetarian  diet.  Walks regularly   She generally feels well. She reports sleeping well. She does not have additional problems to discuss today.   Vision:Within last year Dental:Receives regular dental care STD:The patient denies history of sexually transmitted disease.  Lung Cancer Screening with low-dose Chest CT: n/a - Adults age 60-80 who are current cigarette smokers or quit within the last 15 years. Must have 20 pack year history.  AAA Screening: n/a - Men age 108-75 who have ever smoked  Mammogram: 06/21/22 Last pap: 03/15/22 Colonoscopy: cologuard 2020, ordered today   Pt is without acute concerns.  HLD: cholesterol has been increasing some. Does admit eats a good amount of cheese. She does make her own yogurt at home and eats it but this is likely higher in whole varieties.  Lab Results  Component Value Date   CHOL 241 (H) 03/15/2022   HDL 77.10 03/15/2022   LDLCALC 146 (H) 03/15/2022   LDLDIRECT 146.0 01/02/2018   TRIG 89.0 03/15/2022   CHOLHDL 3 03/15/2022    Advanced Directives Patient does have advanced directives including living will and healthcare power of attorney. She does not have a copy in the electronic medical record.   DEPRESSION SCREENING    03/15/2022    8:47 AM 03/09/2021    9:04 AM 03/03/2020   10:45 AM 01/15/2019    9:01 AM 01/02/2018    9:04 AM 12/15/2015    8:28 AM  PHQ 2/9 Scores  PHQ - 2 Score 0 0 0 0 0 0     ROS: Negative unless specifically indicated above in HPI.    Current Outpatient Medications:    Cholecalciferol (VITAMIN D3) 50 MCG  (2000 UT) capsule, Take 1 capsule (2,000 Units total) by mouth daily., Disp: 90 capsule, Rfl: 3   Fluocinolone Acetonide Scalp 0.01 % OIL, Apply to affected areas on the scalp once or twice a day 5 days a week, Avoid applying to face, groin, and axilla. Use as directed. Long-term use can cause thinning of the skin., Disp: 118 mL, Rfl: 2   folic acid (FOLVITE) 1 MG tablet, Take 1 mg by mouth daily., Disp: , Rfl:    primidone (MYSOLINE) 50 MG tablet, Take 50 mg by mouth 2 (two) times daily. , Disp: , Rfl:     Objective:    BP 122/82 (BP Location: Left Arm)   Pulse 98   Temp 97.9 F (36.6 C) (Temporal)   Ht 5\' 3"  (1.6 m)   Wt 149 lb (67.6 kg)   LMP 01/03/2019   SpO2 98%   BMI 26.39 kg/m   BP Readings from Last 3 Encounters:  05/16/23 122/82  02/21/23 116/77  03/15/22 108/70      Physical Exam Vitals reviewed.  Constitutional:      General: She is not in acute distress.    Appearance: Normal appearance. She is normal weight. She is not ill-appearing.  HENT:     Head: Normocephalic.     Right Ear: Tympanic membrane normal.  Left Ear: Tympanic membrane normal.     Nose: Nose normal.     Mouth/Throat:     Mouth: Mucous membranes are moist.  Eyes:     Extraocular Movements: Extraocular movements intact.     Pupils: Pupils are equal, round, and reactive to light.  Cardiovascular:     Rate and Rhythm: Normal rate and regular rhythm.  Pulmonary:     Effort: Pulmonary effort is normal.     Breath sounds: Normal breath sounds.  Abdominal:     General: Abdomen is flat. Bowel sounds are normal.     Palpations: Abdomen is soft.     Tenderness: There is no guarding or rebound.  Musculoskeletal:        General: Normal range of motion.     Cervical back: Normal range of motion.  Skin:    General: Skin is warm.     Capillary Refill: Capillary refill takes less than 2 seconds.  Neurological:     General: No focal deficit present.     Mental Status: She is alert.  Psychiatric:         Mood and Affect: Mood normal.        Behavior: Behavior normal.        Thought Content: Thought content normal.        Judgment: Judgment normal.          Assessment & Plan:  Benign essential tremor Assessment & Plan: Stable improved with primodone. Continue current treatment f/u with neuro as scheduled.  Orders: -     Comprehensive metabolic panel -     TSH  Nonintractable generalized idiopathic epilepsy without status epilepticus (HCC) Assessment & Plan: Controlled Will do therapeutic monitoring of primidone  Continue f/u with neurologist as scheduled.    Vitamin B12 deficiency Assessment & Plan: Ordered b12 pending results   Orders: -     CBC with Differential/Platelet -     Vitamin B12  Moderate mixed hyperlipidemia not requiring statin therapy Assessment & Plan: Ordered lipid panel, pending results. Work on low cholesterol diet and exercise as tolerated Did discuss with pt statin recommendation, she declines for now.  We will order calcium score as well   Orders: -     Lipid panel -     CT CARDIAC SCORING (SELF PAY ONLY); Future  Vegetarian -     CBC with Differential/Platelet  Screening for colon cancer -     Cologuard  Screening mammogram for breast cancer -     3D Screening Mammogram, Left and Right; Future  Encounter for general adult medical examination without abnormal findings Assessment & Plan: Patient Counseling(The following topics were reviewed):  Preventative care handout given to pt  Health maintenance and immunizations reviewed. Please refer to Health maintenance section. Pt advised on safe sex, wearing seatbelts in car, and proper nutrition labwork ordered today for annual Dental health: Discussed importance of regular tooth brushing, flossing, and dental visits.    Vitamin D deficiency Assessment & Plan: Ordered vitamin d pending results.    Orders: -     VITAMIN D 25 Hydroxy (Vit-D Deficiency, Fractures) -      Vitamin D3; Take 1 capsule (2,000 Units total) by mouth daily.  Dispense: 90 capsule; Refill: 3  Therapeutic drug monitoring -     Primidone, Serum  Post-menopausal Assessment & Plan: Bone density ordered pending results.     Orders: -     DG Bone Density; Future      Follow-up: No  follow-ups on file.   Mort Sawyers, FNP

## 2023-05-16 NOTE — Assessment & Plan Note (Signed)
Ordered vitamin d pending results.   

## 2023-05-16 NOTE — Patient Instructions (Addendum)
------------------------------------    I have sent an electronic order over to your preferred location for the following:   []   2D Mammogram  [x]   3D Mammogram  [x]   Bone Density   Please give this center a call to get scheduled at your convenience.   [x]   [facility]      [address] [place], [place]        [phone]         Make sure to wear two piece  clothing  No lotions powders or deodorants the day of the appointment Make sure to bring picture ID and insurance card.  Bring list of medications you are currently taking including any supplements.    ------------------------------------  I have ordered imaging (calcium score)  for you at [facility] outpatient diagnostic center. This order has been sent over for you electronically.   Please call [phone] to schedule this appointment.   ------------------------------------ Stop by the lab prior to leaving today. I will notify you of your results once received.    Regards,   Mort Sawyers FNP-C

## 2023-05-16 NOTE — Assessment & Plan Note (Signed)
Ordered b12 pending results  °

## 2023-05-16 NOTE — Assessment & Plan Note (Signed)
Ordered lipid panel, pending results. Work on low cholesterol diet and exercise as tolerated Did discuss with pt statin recommendation, she declines for now.  We will order calcium score as well

## 2023-05-16 NOTE — Assessment & Plan Note (Signed)
Controlled Will do therapeutic monitoring of primidone  Continue f/u with neurologist as scheduled.

## 2023-05-16 NOTE — Assessment & Plan Note (Signed)
Bone density ordered pending results.   

## 2023-05-16 NOTE — Assessment & Plan Note (Signed)
Patient Counseling(The following topics were reviewed): ? Preventative care handout given to pt  ?Health maintenance and immunizations reviewed. Please refer to Health maintenance section. ?Pt advised on safe sex, wearing seatbelts in car, and proper nutrition ?labwork ordered today for annual ?Dental health: Discussed importance of regular tooth brushing, flossing, and dental visits. ? ? ?

## 2023-05-16 NOTE — Assessment & Plan Note (Signed)
Stable improved with primodone. Continue current treatment f/u with neuro as scheduled.

## 2023-05-17 LAB — HEMOGLOBIN A1C: Hgb A1c MFr Bld: 5.3 % (ref 4.6–6.5)

## 2023-05-17 LAB — PRIMIDONE, SERUM
PRIMIDONE: <2.5 mg/L — ABNORMAL LOW (ref 5.0–12.0)
Phenobarbital, Serum: <5 mg/L — ABNORMAL LOW (ref 15.0–40.0)

## 2023-05-21 NOTE — Progress Notes (Signed)
Dr. Sherryll Burger, I drew your primodone levels for our mutual patients at her last visit per her request.

## 2023-05-30 ENCOUNTER — Ambulatory Visit: Payer: BC Managed Care – PPO

## 2023-05-30 ENCOUNTER — Ambulatory Visit
Admission: RE | Admit: 2023-05-30 | Discharge: 2023-05-30 | Disposition: A | Discharge status: Home / Self Care | Payer: BC Managed Care – PPO | Source: Ambulatory Visit | Attending: Family | Admitting: Family

## 2023-05-30 DIAGNOSIS — E782 Mixed hyperlipidemia: Secondary | ICD-10-CM

## 2023-06-08 LAB — COLOGUARD: COLOGUARD: NEGATIVE

## 2023-07-11 ENCOUNTER — Ambulatory Visit: Admission: RE | Admit: 2023-07-11 | Payer: BC Managed Care – PPO | Source: Ambulatory Visit

## 2023-07-11 DIAGNOSIS — Z1231 Encounter for screening mammogram for malignant neoplasm of breast: Secondary | ICD-10-CM

## 2023-08-29 ENCOUNTER — Encounter: Payer: Self-pay | Admitting: Dermatology

## 2023-08-29 ENCOUNTER — Ambulatory Visit: Payer: BC Managed Care – PPO | Admitting: Dermatology

## 2023-08-29 DIAGNOSIS — L659 Nonscarring hair loss, unspecified: Secondary | ICD-10-CM | POA: Diagnosis not present

## 2023-08-29 DIAGNOSIS — L639 Alopecia areata, unspecified: Secondary | ICD-10-CM | POA: Diagnosis not present

## 2023-08-29 DIAGNOSIS — Z7189 Other specified counseling: Secondary | ICD-10-CM

## 2023-08-29 DIAGNOSIS — R202 Paresthesia of skin: Secondary | ICD-10-CM | POA: Diagnosis not present

## 2023-08-29 DIAGNOSIS — Z79899 Other long term (current) drug therapy: Secondary | ICD-10-CM | POA: Diagnosis not present

## 2023-08-29 MED ORDER — FLUOCINOLONE ACETONIDE SCALP 0.01 % EX OIL
TOPICAL_OIL | CUTANEOUS | 2 refills | Status: DC
Start: 2023-08-29 — End: 2024-04-09

## 2023-08-29 NOTE — Patient Instructions (Addendum)
For Alopecia Areata  Alopecia areata is a chronic autoimmune condition localized to the skin which affects hair follicles and causes hair loss, most commonly in the scalp.  Cause is unknown.  Can be unpredictable, difficult to treat, and may recur.  Treatments may include topical and intralesional steroids to decrease inflammation to allow for hair regrowth.  Other treatments may include narrowband ultraviolet B light treatment; topical Squairic acid immunotherapy application; topical or oral Minoxidil; antihistamines and oral Jak inhibitors.   Continue Fluocinolone scalp 0.01 % oil    Recommend biotin 2.5 mg by mouth daily   Recommend OTC biotin supplement 2.5  mg daily to help strengthen hair.  Biotin does not promote hair growth except in the setting of underlying biotin deficiency.  Sometimes biotin can cause false results in thyroid function tests, so let your doctor know if you are taking it on a regular basis.  Recommend minoxidil 5% (Rogaine for men) solution or foam to be applied to the scalp and left in. This should ideally be used twice daily for best results but it helps with hair regrowth when used at least three times per week. Rogaine initially can cause increased hair shedding for the first few weeks but this will stop with continued use. In studies, people who used minoxidil (Rogaine) for at least 6 months had thicker hair than people who did not. Minoxidil topical (Rogaine) only works as long as it continues to be used. If if it is no longer used then the hair it has been helping to regrow can fall out. Minoxidil topical (Rogaine) can cause increased facial hair growth which can usually be managed easily with a battery-operated hair trimmer. If facial hair growth is bothersome, switching to the 2% women's version can decrease the risk of unwanted facial hair growth.  Or   Oral version  Doses of minoxidil for hair loss are considered 'low dose'. This is because the doses used for  hair loss are much lower than the doses which are used for conditions such as high blood pressure (hypertension). The doses used for hypertension are 10-40mg  per day.  Side effects are uncommon at the low doses (up to 2.5 mg/day) used to treat hair loss. Potential side effects, more commonly seen at higher doses, include: Increase in hair growth (hypertrichosis) elsewhere on face and body Temporary hair shedding upon starting medication which may last up to 4 weeks Ankle swelling, fluid retention, rapid weight gain more than 5 pounds Low blood pressure and feeling lightheaded or dizzy when standing up quickly Fast or irregular heartbeat Headaches   Due to recent changes in healthcare laws, you may see results of your pathology and/or laboratory studies on MyChart before the doctors have had a chance to review them. We understand that in some cases there may be results that are confusing or concerning to you. Please understand that not all results are received at the same time and often the doctors may need to interpret multiple results in order to provide you with the best plan of care or course of treatment. Therefore, we ask that you please give [place] 2 business days to thoroughly review all your results before contacting the office for clarification. Should we see a critical lab result, you will be contacted sooner.   If You Need Anything After Your Visit  If you have any questions or concerns for your doctor, please call our main line at [phone] and press option 4 to reach your doctor's medical assistant. If no  one answers, please leave a voicemail as directed and we will return your call as soon as possible. Messages left after 4 pm will be answered the following business day.   You may also send [place] a message via MyChart. We typically respond to MyChart messages within 1-2 business days.  For prescription refills, please ask your pharmacy to contact our office. Our fax number is  [phone].  If you have an urgent issue when the clinic is closed that cannot wait until the next business day, you can page your doctor at the number below.    Please note that while we do our best to be available for urgent issues outside of office hours, we are not available 24/7.   If you have an urgent issue and are unable to reach [place], you may choose to seek medical care at your doctor's office, retail clinic, urgent care center, or emergency room.  If you have a medical emergency, please immediately call 911 or go to the emergency department.  Pager Numbers  - Dr. Gwen Pounds: [phone]  - Dr. Roseanne Reno: [phone]  - Dr. Katrinka Blazing: [phone]   In the event of inclement weather, please call our main line at [phone] for an update on the status of any delays or closures.  Dermatology Medication Tips: Please keep the boxes that topical medications come in in order to help keep track of the instructions about where and how to use these. Pharmacies typically print the medication instructions only on the boxes and not directly on the medication tubes.   If your medication is too expensive, please contact our office at [phone] option 4 or send [place] a message through MyChart.   We are unable to tell what your co-pay for medications will be in advance as this is different depending on your insurance coverage. However, we may be able to find a substitute medication at lower cost or fill out paperwork to get insurance to cover a needed medication.   If a prior authorization is required to get your medication covered by your insurance company, please allow [place] 1-2 business days to complete this process.  Drug prices often vary depending on where the prescription is filled and some pharmacies may offer cheaper prices.  The website www.goodrx.com contains coupons for medications through different pharmacies. The prices here do not account for what the cost may be with help from  insurance (it may be cheaper with your insurance), but the website can give you the price if you did not use any insurance.  - You can print the associated coupon and take it with your prescription to the pharmacy.  - You may also stop by our office during regular business hours and pick up a GoodRx coupon card.  - If you need your prescription sent electronically to a different pharmacy, notify our office through [facility] or by phone at [phone] option 4.     Si Usted Necesita Algo Despus de Su Visita  Tambin puede enviarnos un mensaje a travs de Clinical cytogeneticist. Por lo general respondemos a los mensajes de MyChart en el transcurso de 1 a 2 das hbiles.  Para renovar recetas, por favor pida a su farmacia que se ponga en contacto con nuestra oficina. Annie Sable de fax es [place] [phone].  Si tiene un asunto urgente cuando la clnica est cerrada y que no puede esperar hasta el siguiente da hbil, puede llamar/localizar a su doctor(a) al nmero que aparece a continuacin.   Por favor, tenga en  cuenta que aunque hacemos todo lo posible para estar disponibles para asuntos urgentes fuera del horario de [place], no estamos disponibles las 24 horas del da, los 7 [address]  [address] de la [place].   Si tiene un problema urgente y no puede comunicarse con nosotros, puede optar por buscar atencin mdica  en el consultorio de su doctor(a), en una clnica privada, en un centro de atencin urgente o en una sala de emergencias.  Si tiene Engineer, drilling, por favor llame inmediatamente al 911 o vaya a la sala de emergencias.  Nmeros de bper  - Dr. Gwen Pounds: [phone]  - Dra. Roseanne Reno: [phone]  - Dr. Katrinka Blazing: [phone]   En caso de inclemencias del tiempo, por favor llame a Lacy Duverney principal al [phone] para una actualizacin sobre el [place] de cualquier retraso o cierre.  Consejos para la medicacin en dermatologa: Por favor, guarde las cajas en las que vienen los  medicamentos de uso tpico para ayudarle a seguir las instrucciones sobre dnde y cmo usarlos. Las farmacias generalmente imprimen las instrucciones del medicamento slo en las cajas y no directamente en los tubos del [place].   Si su medicamento es muy caro, por favor, pngase en contacto con Rolm Gala llamando al [phone] y presione la opcin 4 o envenos un mensaje a travs de Clinical cytogeneticist.   No podemos decirle cul ser su copago por los medicamentos por adelantado ya que esto es diferente dependiendo de la cobertura de su seguro. Sin embargo, es posible que podamos encontrar un medicamento sustituto a Audiological scientist un formulario para que el seguro cubra el medicamento que se considera necesario.   Si se requiere una autorizacin previa para que su compaa de seguros [place] su medicamento, por favor permtanos de 1 a 2 das hbiles para completar [address]  Los precios de los medicamentos varan con frecuencia dependiendo del Environmental consultant de dnde se surte la receta y alguna farmacias pueden ofrecer precios ms baratos.  El sitio web www.goodrx.com tiene cupones para medicamentos de Health and safety inspector. Los precios aqu no tienen en cuenta lo que podra costar con la ayuda del seguro (puede ser ms barato con su seguro), pero el sitio web puede darle el precio si no utiliz Tourist information centre manager.  - Puede imprimir el cupn correspondiente y llevarlo con su receta a la farmacia.  - Tambin puede pasar por nuestra oficina durante el horario de atencin regular y Education officer, museum una tarjeta de cupones de GoodRx.  - Si necesita que su receta se enve electrnicamente a una farmacia diferente, informe a nuestra oficina a travs de MyChart de [place] o por telfono llamando al [phone] y presione la opcin 4.

## 2023-08-29 NOTE — Progress Notes (Signed)
Follow-Up Visit   Subjective  Christine Saunders is a 57 y.o. female who presents for the following: 6 month follow up on alopecia. Reports has noticed some new areas of baldness at scalp but other areas have improved while using fluocinolone oil to scalp. Also has noticed some discoloration at lower legs and changes in finger nails she would like to discuss.  The patient has spots, moles and lesions to be evaluated, some may be new or changing and the patient may have concern these could be cancer.   The following portions of the chart were reviewed this encounter and updated as appropriate: medications, allergies, medical history  Review of Systems:  No other skin or systemic complaints except as noted in HPI or Assessment and Plan.  Objective  Well appearing patient in no apparent distress; mood and affect are within normal limits.    A focused examination was performed of the following areas: Scalp, back, fingernails, left lower leg  Relevant exam findings are noted in the Assessment and Plan.    Assessment & Plan    Alopecia areata  2 x 2.5 cm patch at right of midline posterior hairline, 2 x 2.5 cm patch at left scalp, and 1 cm patch at right posterior crown. Reviewed previous photos  Reviewed labs from 05/16/23 CBC with diff and TSH  Normal results    Can continue Fluocinolone Scalp 0.01 % oil apply to affected areas on the scalp once or twice a day 5 days a week. Can use less if desired. Avoid applying to face, groin, and axilla. Use as directed. Long-term use can cause thinning of the skin. Pt declines all other treatment for alopecia areata at this time.  She wants to continue the scalp oil only.  Recommend 2.5 mg tab of biotin daily And Oral non-sedating anti-histamine such as Claritin or Allegra daily   Recommend minoxidil 5% (Rogaine for men) solution or foam to be applied to the scalp and left in. This should ideally be used twice daily for best results but it helps  with hair regrowth when used at least three times per week. Rogaine initially can cause increased hair shedding for the first few weeks but this will stop with continued use. In studies, people who used minoxidil (Rogaine) for at least 6 months had thicker hair than people who did not. Minoxidil topical (Rogaine) only works as long as it continues to be used. If if it is no longer used then the hair it has been helping to regrow can fall out. Minoxidil topical (Rogaine) can cause increased facial hair growth which can usually be managed easily with a battery-operated hair trimmer. If facial hair growth is bothersome, switching to the 2% women's version can decrease the risk of unwanted facial hair growth.  Or  Discussed oral minoxidil 2.5 mg - 1/2 tab po qd   Patient will send mychart if interested in starting oral rx of minoxidil.   Alopecia areata is a chronic autoimmune condition localized to the skin which affects hair follicles and causes hair loss, most commonly in the scalp.  Cause is unknown.  Can be unpredictable, difficult to treat, and may recur.  Treatments may include topical and intralesional steroids to decrease inflammation to allow for hair regrowth.  Other treatments may include narrowband ultraviolet B light treatment; topical Squairic acid immunotherapy application; topical or oral Minoxidil; antihistamines and oral Jak inhibitors.  Notalgia paresthetica Mid Back Notalgia paresthetica is a chronic condition affecting the skin of the back  in which a pinched nerve along the spine causes itching or changes in sensation in an area of skin. This is usually accompanied by chronic rubbing or scratching often leaving the area of skin discolored and thickened. There is no cure, but there are some treatments which may help control the itch.   Over the counter (non-prescription) treatments for notalgia paresthetica include numbing creams like pramoxine or lidocaine which temporarily reduce itch  or Capsaicin-containing creams which cause a burning sensation but which sometimes over time will reset the nerves to stop producing itch.  If you choose to use Capsaicin cream, it is recommended to use it 5 times daily for 1 week followed by 3 times daily for 3-6 weeks. You may have to continue using it long-term. For severe cases, there are some prescription cream or pill options which may help. Other treatment options include: - Transcutaneous Electrical Nerve Stimulation (TENS) - Gabapentin 300-900 mg daily po - Amitriptyline orally - Paravertebral local anesthetic block - intralesional Botulinum toxin A    Can continue fluocinolone acetonide scalp 0.01 % oil as directed  Ridging in fingernails b/l hands Exam: ridging in fingernails Treatment Plan: Normal aging process  Benign-appearing.  Observation.  Call clinic for new or changing lesions.  Recommend daily use of broad spectrum spf 30+ sunscreen to sun-exposed areas.   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  Treatment Plan: Benign. Observe. Do not recommend any treatment. Benign-appearing.  Observation.  Call clinic for new or changing lesions.  Recommend daily use of broad spectrum spf 30+ sunscreen to sun-exposed areas.     Return in about 6 months (around 02/26/2024) for alopecia follow up.  IAsher Muir, CMA, am acting as scribe for Armida Sans, MD.   Documentation: I have reviewed the above documentation for accuracy and completeness, and I agree with the above.  Armida Sans, MD

## 2024-01-02 ENCOUNTER — Ambulatory Visit
Admission: RE | Admit: 2024-01-02 | Discharge: 2024-01-02 | Disposition: A | Discharge status: Home / Self Care | Payer: 59 | Source: Ambulatory Visit | Attending: Family | Admitting: Family

## 2024-01-02 DIAGNOSIS — Z780 Asymptomatic menopausal state: Secondary | ICD-10-CM

## 2024-04-09 ENCOUNTER — Encounter: Payer: Self-pay | Admitting: Dermatology

## 2024-04-09 ENCOUNTER — Ambulatory Visit: Payer: BC Managed Care – PPO | Admitting: Dermatology

## 2024-04-09 DIAGNOSIS — L659 Nonscarring hair loss, unspecified: Secondary | ICD-10-CM

## 2024-04-09 DIAGNOSIS — L219 Seborrheic dermatitis, unspecified: Secondary | ICD-10-CM

## 2024-04-09 DIAGNOSIS — R202 Paresthesia of skin: Secondary | ICD-10-CM | POA: Diagnosis not present

## 2024-04-09 DIAGNOSIS — L639 Alopecia areata, unspecified: Secondary | ICD-10-CM

## 2024-04-09 DIAGNOSIS — Z7189 Other specified counseling: Secondary | ICD-10-CM

## 2024-04-09 DIAGNOSIS — Z79899 Other long term (current) drug therapy: Secondary | ICD-10-CM

## 2024-04-09 MED ORDER — FLUOCINOLONE ACETONIDE SCALP 0.01 % EX OIL: TOPICAL_OIL | CUTANEOUS | 6 refills | Status: AC

## 2024-04-09 MED ORDER — KETOCONAZOLE 2 % EX SHAM: MEDICATED_SHAMPOO | CUTANEOUS | 11 refills | Status: DC

## 2024-04-09 NOTE — Patient Instructions (Addendum)
 Alopecia areata is a chronic autoimmune condition localized to the skin which affects hair follicles and causes hair loss, most commonly in the scalp.  Cause is unknown.  Can be unpredictable, difficult to treat, and may recur.  Treatments may include topical and intralesional steroids to decrease inflammation to allow for hair regrowth.  Other treatments may include narrowband ultraviolet B light treatment; topical Squairic acid immunotherapy application; topical or oral Minoxidil; antihistamines and oral Jak inhibitors.   Recommend OTC biotin supplement 2.5 -5 mg daily to help strengthen dry brittle nails and hair.  Biotin does not promote hair growth except in the setting of underlying biotin deficiency.  Sometimes biotin can cause false results in thyroid function tests, so let your doctor know if you are taking it on a regular basis.  Recommend Oral non-sedating anti-histamine such as Claritin 10 mg by mouth daily or Allegra 10 mg by mouth daily daily  Recommend OTC Head & Shoulders shampoo 2-3x per week, massage into scalp and let sit 3-5 minutes before rinsing.      Due to recent changes in healthcare laws, you may see results of your pathology and/or laboratory studies on MyChart before the doctors have had a chance to review them. We understand that in some cases there may be results that are confusing or concerning to you. Please understand that not all results are received at the same time and often the doctors may need to interpret multiple results in order to provide you with the best plan of care or course of treatment. Therefore, we ask that you please give [place] 2 business days to thoroughly review all your results before contacting the office for clarification. Should we see a critical lab result, you will be contacted sooner.   If You Need Anything After Your Visit  If you have any questions or concerns for your doctor, please call our main line at [phone] and press option 4  to reach your doctor's medical assistant. If no one answers, please leave a voicemail as directed and we will return your call as soon as possible. Messages left after 4 pm will be answered the following business day.   You may also send [place] a message via MyChart. We typically respond to MyChart messages within 1-2 business days.  For prescription refills, please ask your pharmacy to contact our office. Our fax number is [phone].  If you have an urgent issue when the clinic is closed that cannot wait until the next business day, you can page your doctor at the number below.    Please note that while we do our best to be available for urgent issues outside of office hours, we are not available 24/7.   If you have an urgent issue and are unable to reach [place], you may choose to seek medical care at your doctor's office, retail clinic, urgent care center, or emergency room.  If you have a medical emergency, please immediately call 911 or go to the emergency department.  Pager Numbers  - Dr. Gwen Pounds: [phone]  - Dr. Roseanne Reno: [phone]  - Dr. Katrinka Blazing: [phone]   In the event of inclement weather, please call our main line at [phone] for an update on the status of any delays or closures.  Dermatology Medication Tips: Please keep the boxes that topical medications come in in order to help keep track of the instructions about where and how to use these. Pharmacies typically print the medication instructions only on the boxes and not directly  on the medication tubes.   If your medication is too expensive, please contact our office at [phone] option 4 or send [place] a message through MyChart.   We are unable to tell what your co-pay for medications will be in advance as this is different depending on your insurance coverage. However, we may be able to find a substitute medication at lower cost or fill out paperwork to get insurance to cover a needed medication.   If a prior  authorization is required to get your medication covered by your insurance company, please allow [place] 1-2 business days to complete this process.  Drug prices often vary depending on where the prescription is filled and some pharmacies may offer cheaper prices.  The website www.goodrx.com contains coupons for medications through different pharmacies. The prices here do not account for what the cost may be with help from insurance (it may be cheaper with your insurance), but the website can give you the price if you did not use any insurance.  - You can print the associated coupon and take it with your prescription to the pharmacy.  - You may also stop by our office during regular business hours and pick up a GoodRx coupon card.  - If you need your prescription sent electronically to a different pharmacy, notify our office through [facility] or by phone at [phone] option 4.     Si Usted Necesita Algo Despus de Su Visita  Tambin puede enviarnos un mensaje a travs de Clinical cytogeneticist. Por lo general respondemos a los mensajes de MyChart en el transcurso de 1 a 2 das hbiles.  Para renovar recetas, por favor pida a su farmacia que se ponga en contacto con nuestra oficina. Annie Sable de fax es [place] [phone].  Si tiene un asunto urgente cuando la clnica est cerrada y que no puede esperar hasta el siguiente da hbil, puede llamar/localizar a su doctor(a) al nmero que aparece a continuacin.   Por favor, tenga en cuenta que aunque hacemos todo lo posible para estar disponibles para asuntos urgentes fuera del horario de [place], no estamos disponibles las 24 horas del da, los 7 [address]  [address] de la [place].   Si tiene un problema urgente y no puede comunicarse con nosotros, puede optar por buscar atencin mdica  en el consultorio de su doctor(a), en una clnica privada, en un centro de atencin urgente o en una sala de emergencias.  Si tiene Engineer, drilling, por favor llame  inmediatamente al 911 o vaya a la sala de emergencias.  Nmeros de bper  - Dr. Gwen Pounds: [phone]  - Dra. Roseanne Reno: [phone]  - Dr. Katrinka Blazing: [phone]   En caso de inclemencias del tiempo, por favor llame a Lacy Duverney principal al [phone] para una actualizacin sobre el [place] de cualquier retraso o cierre.  Consejos para la medicacin en dermatologa: Por favor, guarde las cajas en las que vienen los medicamentos de uso tpico para ayudarle a seguir las instrucciones sobre dnde y cmo usarlos. Las farmacias generalmente imprimen las instrucciones del medicamento slo en las cajas y no directamente en los tubos del [place].   Si su medicamento es muy caro, por favor, pngase en contacto con Rolm Gala llamando al [phone] y presione la opcin 4 o envenos un mensaje a travs de Clinical cytogeneticist.   No podemos decirle cul ser su copago por los medicamentos por adelantado ya que esto es diferente dependiendo de la cobertura de su seguro. Sin embargo, es posible que podamos encontrar un medicamento  sustituto a Audiological scientist un formulario para que el seguro cubra el medicamento que se considera necesario.   Si se requiere una autorizacin previa para que su compaa de seguros [place] su medicamento, por favor permtanos de 1 a 2 das hbiles para completar este proceso.  Los precios de los medicamentos varan con frecuencia dependiendo del Environmental consultant de dnde se surte la receta y alguna farmacias pueden ofrecer precios ms baratos.  El sitio web www.goodrx.com tiene cupones para medicamentos de Health and safety inspector. Los precios aqu no tienen en cuenta lo que podra costar con la ayuda del seguro (puede ser ms barato con su seguro), pero el sitio web puede darle el precio si no utiliz Tourist information centre manager.  - Puede imprimir el cupn correspondiente y llevarlo con su receta a la farmacia.  - Tambin puede pasar por nuestra oficina durante el horario de atencin regular y  Education officer, museum una tarjeta de cupones de GoodRx.  - Si necesita que su receta se enve electrnicamente a una farmacia diferente, informe a nuestra oficina a travs de MyChart de [place] o por telfono llamando al [phone] y presione la opcin 4.

## 2024-04-09 NOTE — Progress Notes (Signed)
 Follow-Up Visit   Subjective  Christine Saunders is a 58 y.o. female who presents for the following:  6 month follow up for alopecia areata using fluocinoline oil to scalp. Still feels condition has not been improving and developing new patches at scalp.  The patient has spots, moles and lesions to be evaluated, some may be new or changing and the patient may have concern these could be cancer.  The following portions of the chart were reviewed this encounter and updated as appropriate: medications, allergies, medical history  Review of Systems:  No other skin or systemic complaints except as noted in HPI or Assessment and Plan.  Objective  Well appearing patient in no apparent distress; mood and affect are within normal limits.  A focused examination was performed of the following areas: Scalp  Relevant exam findings are noted in the Assessment and Plan.  Scalp Clear at exam  Scalp  3 x 2 cm patch at left posterior crown and 1 to 2 cm patches (3 each) at posterior scalp   Assessment & Plan    SEBORRHEIC DERMATITIS Scalp Seborrheic Dermatitis  -  is a chronic persistent rash characterized by pinkness and scaling most commonly of the mid face but also can occur on the scalp (dandruff), ears; mid chest, mid back and groin.  It tends to be exacerbated by stress and cooler weather.  People who have neurologic disease may experience new onset or exacerbation of existing seborrheic dermatitis.  The condition is not curable but treatable and can be controlled.  Start Ketoconazole 2 % shampoo - apply three times per week, massage into scalp and leave in for 10 minutes before rinsing out  ketoconazole (NIZORAL) 2 % shampoo - Scalp Apply two -  three times per week, massage into scalp and leave in for 5 minutes before rinsing out ALOPECIA   Related Medications Fluocinolone Acetonide Scalp 0.01 % OIL Apply to affected areas on the scalp once or twice a day 5 days a week, Avoid applying to  face, groin, and axilla. Use as directed. Long-term use can cause thinning of the skin. NOTALGIA PARESTHETICA   Related Medications Fluocinolone Acetonide Scalp 0.01 % OIL Apply to affected areas on the scalp once or twice a day 5 days a week, Avoid applying to face, groin, and axilla. Use as directed. Long-term use can cause thinning of the skin. ALOPECIA AREATA Scalp  Alopecia areata is a chronic autoimmune condition localized to the skin which affects hair follicles and causes hair loss, most commonly in the scalp.  Cause is unknown.  Can be unpredictable, difficult to treat, and may recur.  Treatments may include topical and intralesional steroids to decrease inflammation to allow for hair regrowth.  Other treatments may include narrowband ultraviolet B light treatment; topical Squairic acid immunotherapy application; topical or oral Minoxidil; antihistamines and oral Jak inhibitors. Reviewed the above treatment options in extensive detail again today.  Patient has been hesitant to use any treatment other than the topical steroid he is currently using-semaglutide oral. She, again states today she will likely just stick with the topical steroid and not pursue other systemic treatments.  Reviewed labs from 05/16/2023 CBC with Diff, TSH, LIpid Panel, Vitamin D 25, Vitamin B12, and CMP - all were within normal range  Treatment:  Discussed steroid injections, oral and topical minoxidil, oral biotin- patient declined   Recommend OTC biotin supplement 2.5 -5 mg daily to help strengthen dry brittle nails and hair.  Biotin does not promote hair  growth except in the setting of underlying biotin deficiency.  Sometimes biotin can cause false results in thyroid function tests, so let your doctor know if you are taking it on a regular basis.  Recommend Oral non - sedating anti-histamine such as Claritin 10 mg or Allegra 10 mg take 1 by mouth daily  Patient would like to continue Fluocinolone scalp 0.01 %  oil   Can continue Fluocinolone Scalp 0.01 % oil apply to affected areas on the scalp once or twice a day 5 days a week. Can use less if desired. Avoid applying to face, groin, and axilla. Use as directed. Long-term use can cause thinning of the skin. Pt declines all other treatment for alopecia areata at this time.  She wants to continue the scalp oil only.   Return for schedule June 2026 for alopecia areata follow up.  IRandee Busing, CMA, am acting as scribe for Celine Collard, MD.   Documentation: I have reviewed the above documentation for accuracy and completeness, and I agree with the above.  Celine Collard, MD

## 2024-05-21 ENCOUNTER — Ambulatory Visit: Payer: BC Managed Care – PPO | Admitting: Family

## 2024-05-21 ENCOUNTER — Encounter: Payer: Self-pay | Admitting: Family

## 2024-05-21 VITALS — BP 124/68 | HR 64 | Temp 98.1°F | Ht 63.0 in | Wt 141.0 lb

## 2024-05-21 DIAGNOSIS — Z0000 Encounter for general adult medical examination without abnormal findings: Secondary | ICD-10-CM | POA: Diagnosis not present

## 2024-05-21 DIAGNOSIS — Z131 Encounter for screening for diabetes mellitus: Secondary | ICD-10-CM

## 2024-05-21 DIAGNOSIS — E559 Vitamin D deficiency, unspecified: Secondary | ICD-10-CM

## 2024-05-21 DIAGNOSIS — Z789 Other specified health status: Secondary | ICD-10-CM | POA: Diagnosis not present

## 2024-05-21 DIAGNOSIS — Z5181 Encounter for therapeutic drug level monitoring: Secondary | ICD-10-CM

## 2024-05-21 DIAGNOSIS — E782 Mixed hyperlipidemia: Secondary | ICD-10-CM | POA: Diagnosis not present

## 2024-05-21 DIAGNOSIS — Z1231 Encounter for screening mammogram for malignant neoplasm of breast: Secondary | ICD-10-CM

## 2024-05-21 DIAGNOSIS — Z862 Personal history of diseases of the blood and blood-forming organs and certain disorders involving the immune mechanism: Secondary | ICD-10-CM | POA: Insufficient documentation

## 2024-05-21 DIAGNOSIS — E538 Deficiency of other specified B group vitamins: Secondary | ICD-10-CM

## 2024-05-21 LAB — VITAMIN D 25 HYDROXY (VIT D DEFICIENCY, FRACTURES): VITD: 31.54 ng/mL (ref 30.00–100.00)

## 2024-05-21 LAB — CBC
HCT: 39 % (ref 36.0–46.0)
Hemoglobin: 13.1 g/dL (ref 12.0–15.0)
MCHC: 33.7 g/dL (ref 30.0–36.0)
MCV: 86.3 fl (ref 78.0–100.0)
Platelets: 322 10*3/uL (ref 150.0–400.0)
RBC: 4.52 Mil/uL (ref 3.87–5.11)
RDW: 12.4 % (ref 11.5–15.5)
WBC: 4.8 10*3/uL (ref 4.0–10.5)

## 2024-05-21 LAB — LIPID PANEL
Cholesterol: 233 mg/dL — ABNORMAL HIGH (ref 0–200)
HDL: 73.2 mg/dL (ref >39.00)
LDL Cholesterol: 138 mg/dL — ABNORMAL HIGH (ref 0–99)
NonHDL: 159.32
Total CHOL/HDL Ratio: 3
Triglycerides: 108 mg/dL (ref 0.0–149.0)
VLDL: 21.6 mg/dL (ref 0.0–40.0)

## 2024-05-21 LAB — COMPREHENSIVE METABOLIC PANEL WITH GFR
ALT: 14 U/L (ref 0–35)
AST: 17 U/L (ref 0–37)
Albumin: 4.6 g/dL (ref 3.5–5.2)
Alkaline Phosphatase: 95 U/L (ref 39–117)
BUN: 12 mg/dL (ref 6–23)
CO2: 31 meq/L (ref 19–32)
Calcium: 9.6 mg/dL (ref 8.4–10.5)
Chloride: 102 meq/L (ref 96–112)
Creatinine, Ser: 0.58 mg/dL (ref 0.40–1.20)
GFR: 101.47 mL/min (ref >60.00)
Glucose, Bld: 86 mg/dL (ref 70–99)
Potassium: 4.5 meq/L (ref 3.5–5.1)
Sodium: 138 meq/L (ref 135–145)
Total Bilirubin: 0.5 mg/dL (ref 0.2–1.2)
Total Protein: 7 g/dL (ref 6.0–8.3)

## 2024-05-21 LAB — IBC + FERRITIN
Ferritin: 24.6 ng/mL (ref 10.0–291.0)
Iron: 115 ug/dL (ref 42–145)
Saturation Ratios: 22.3 % (ref 20.0–50.0)
TIBC: 516.6 ug/dL — ABNORMAL HIGH (ref 250.0–450.0)
Transferrin: 369 mg/dL — ABNORMAL HIGH (ref 212.0–360.0)

## 2024-05-21 LAB — VITAMIN B12: Vitamin B-12: 412 pg/mL (ref 211–911)

## 2024-05-21 LAB — TSH: TSH: 1.39 u[IU]/mL (ref 0.35–5.50)

## 2024-05-21 LAB — HEMOGLOBIN A1C: Hgb A1c MFr Bld: 5.4 % (ref 4.6–6.5)

## 2024-05-21 NOTE — Assessment & Plan Note (Signed)

## 2024-05-21 NOTE — Patient Instructions (Signed)
  I have sent an electronic order over to your preferred location for the following:   []   2D Mammogram  [x]   3D Mammogram  []   Bone Density   Please give this center a call to get scheduled at your convenience.   [x]   [facility]      [address] [place], [place]        [phone]         Make sure to wear two piece  clothing  No lotions powders or deodorants the day of the appointment Make sure to bring picture ID and insurance card.  Bring list of medications you are currently taking including any supplements.   Do not schedule until after 07/10/24  ------------------------------------

## 2024-05-21 NOTE — Progress Notes (Signed)
 Subjective:  Patient ID: Christine Saunders, female    DOB: [birth year]  Age: 58 y.o. MRN: [id number]  Patient Care Team: Felicita Horns, FNP as PCP - General (Family Medicine)   CC:  Chief Complaint  Patient presents with   Annual Exam    HPI Christine Saunders is a 58 y.o. female who presents today for an annual physical exam. She reports consuming a vegetarian diet. Gym x 2 times a week otherwise during the week tries to aim for 7000 to 10000 steps a day She generally feels well. She reports sleeping well. She does not have additional problems to discuss today.   Vision:Within last year Dental:Receives regular dental care  Mammogram: 07/10/24 Last pap: March 15, 2022 negative Colonoscopy: cologuard June 02 2023  Bone density scan: 01/02/24 normal  Due for shingles vaccine   Pt is without acute concerns.   HLD: pt declines statin at this time. She is a vegetarian so diet is pretty carb heavy.   Advanced Directives Patient does have advanced directives. She does not have a copy in the electronic medical record.      DEPRESSION SCREENING    05/21/2024    9:33 AM 03/15/2022    8:47 AM 03/09/2021    9:04 AM 03/03/2020   10:45 AM 01/15/2019    9:01 AM 01/02/2018    9:04 AM 12/15/2015    8:28 AM  PHQ 2/9 Scores  PHQ - 2 Score 0 0 0 0 0 0 0  PHQ- 9 Score 0           ROS: Negative unless specifically indicated above in HPI.    Current Outpatient Medications:    Cholecalciferol (VITAMIN D3) 50 MCG (2000 UT) capsule, Take 1 capsule (2,000 Units total) by mouth daily., Disp: 90 capsule, Rfl: 3   Fluocinolone  Acetonide Scalp 0.01 % OIL, Apply to affected areas on the scalp once or twice a day 5 days a week, Avoid applying to face, groin, and axilla. Use as directed. Long-term use can cause thinning of the skin., Disp: 118.28 mL, Rfl: 6   folic acid (FOLVITE) 1 MG tablet, Take 1 mg by mouth daily., Disp: , Rfl:    primidone  (MYSOLINE ) 50 MG tablet, Take 50 mg by mouth 2 (two) times daily. , Disp:  , Rfl:     Objective:    BP 124/68 (BP Location: Left Arm, Patient Position: Sitting, Cuff Size: Normal)   Pulse 64   Temp 98.1 F (36.7 C) (Oral)   Ht 5\' 3"  (1.6 m)   Wt 141 lb (64 kg)   LMP 01/03/2019   SpO2 97%   BMI 24.98 kg/m   BP Readings from Last 3 Encounters:  05/21/24 124/68  05/16/23 122/82  02/21/23 116/77      Physical Exam Vitals reviewed.  Constitutional:      General: She is not in acute distress.    Appearance: Normal appearance. She is normal weight. She is not ill-appearing.  HENT:     Head: Normocephalic.     Right Ear: Tympanic membrane normal.     Left Ear: Tympanic membrane normal.     Nose: Nose normal.     Mouth/Throat:     Mouth: Mucous membranes are moist.  Eyes:     Extraocular Movements: Extraocular movements intact.     Pupils: Pupils are equal, round, and reactive to light.  Cardiovascular:     Rate and Rhythm: Normal rate and regular rhythm.  Pulmonary:     Effort:  Pulmonary effort is normal.     Breath sounds: Normal breath sounds.  Abdominal:     General: Abdomen is flat. Bowel sounds are normal.     Palpations: Abdomen is soft.     Tenderness: There is no guarding or rebound.  Musculoskeletal:        General: Normal range of motion.     Cervical back: Normal range of motion.  Skin:    General: Skin is warm.     Capillary Refill: Capillary refill takes less than 2 seconds.  Neurological:     General: No focal deficit present.     Mental Status: She is alert.  Psychiatric:        Mood and Affect: Mood normal.        Behavior: Behavior normal.        Thought Content: Thought content normal.        Judgment: Judgment normal.          Assessment & Plan:  Vitamin B12 deficiency Assessment & Plan: Ordered b12 pending results   Orders: -     CBC -     Vitamin B12  Vitamin D  deficiency Assessment & Plan: Ordered vitamin d  pending results.    Orders: -     VITAMIN D  25 Hydroxy (Vit-D Deficiency,  Fractures)  Vegetarian  Mixed hyperlipidemia Assessment & Plan: Long d/w pt on lipid control, overal ASCVD risk low. Will monitor lipid panel.  Ordering LPA to rule out genetic component pending results.  Ordered lipid panel, pending results. Work on low cholesterol diet and exercise as tolerated   Orders: -     Lipoprotein A (LPA) -     Lipid panel  Encounter for general adult medical examination without abnormal findings Assessment & Plan: Patient Counseling(The following topics were reviewed):  Preventative care handout given to pt  Health maintenance and immunizations reviewed. Please refer to Health maintenance section. Pt advised on safe sex, wearing seatbelts in car, and proper nutrition labwork ordered today for annual Dental health: Discussed importance of regular tooth brushing, flossing, and dental visits.   Orders: -     Lipoprotein A (LPA) -     Lipid panel -     CBC -     TSH -     Hemoglobin A1c -     Comprehensive metabolic panel with GFR  Encounter for therapeutic drug monitoring -     Primidone , Serum  History of iron deficiency anemia -     IBC + Ferritin  Screening mammogram for breast cancer -     3D Screening Mammogram, Left and Right; Future   ASCVD risk is very low 2.0 %      Follow-up: Return in about 1 year (around 05/21/2025) for f/u CPE.   Felicita Horns, FNP

## 2024-05-21 NOTE — Assessment & Plan Note (Addendum)
 Long d/w pt on lipid control, overal ASCVD risk low. Will monitor lipid panel.  Ordering LPA to rule out genetic component pending results.  Ordered lipid panel, pending results. Work on low cholesterol diet and exercise as tolerated

## 2024-05-22 ENCOUNTER — Ambulatory Visit: Payer: Self-pay | Admitting: Family

## 2024-05-23 LAB — PRIMIDONE, SERUM
Phenobarbital, Serum: NOT DETECTED ug/mL (ref 15–40)
Primidone Lvl: NOT DETECTED ug/mL (ref 5.0–12.0)

## 2024-05-24 LAB — LIPOPROTEIN A (LPA): Lipoprotein (a): 134 nmol/L — ABNORMAL HIGH (ref <75)

## 2024-05-25 ENCOUNTER — Encounter: Payer: Self-pay | Admitting: Family

## 2024-05-25 NOTE — Assessment & Plan Note (Signed)
 Ordered vitamin d pending results.

## 2024-05-25 NOTE — Assessment & Plan Note (Signed)
 Ordered b12 pending results

## 2024-05-25 NOTE — Assessment & Plan Note (Signed)

## 2024-07-16 ENCOUNTER — Ambulatory Visit
Admission: RE | Admit: 2024-07-16 | Discharge: 2024-07-16 | Disposition: A | Discharge status: Home / Self Care | Source: Ambulatory Visit | Attending: Family

## 2024-07-16 DIAGNOSIS — Z1231 Encounter for screening mammogram for malignant neoplasm of breast: Secondary | ICD-10-CM

## 2025-05-27 ENCOUNTER — Encounter: Admitting: Family

## 2025-06-10 ENCOUNTER — Ambulatory Visit: Admitting: Dermatology
